# Patient Record
Sex: Male | Born: 1970 | Race: White | Hispanic: No | Marital: Married | Smoking: Never smoker
Health system: Southern US, Community
[De-identification: ages and names within clinical notes are randomized; demographics above are authoritative.]

## PROBLEM LIST (undated history)

## (undated) DIAGNOSIS — I10 Essential (primary) hypertension: Secondary | ICD-10-CM

## (undated) HISTORY — PX: WRIST SURGERY: SHX841

---

## 2008-04-08 ENCOUNTER — Emergency Department (HOSPITAL_COMMUNITY): Admission: EM | Admit: 2008-04-08 | Discharge: 2008-04-08 | Payer: Self-pay | Admitting: Emergency Medicine

## 2009-08-04 ENCOUNTER — Emergency Department (HOSPITAL_COMMUNITY): Admission: EM | Admit: 2009-08-04 | Discharge: 2009-08-04 | Payer: Self-pay | Admitting: Emergency Medicine

## 2010-01-11 ENCOUNTER — Emergency Department (HOSPITAL_COMMUNITY)
Admission: EM | Admit: 2010-01-11 | Discharge: 2010-01-11 | Payer: Self-pay | Source: Home / Self Care | Admitting: Emergency Medicine

## 2010-06-22 NOTE — Op Note (Signed)
NAME:  DAMASCUS, FELDPAUSCH NO.:  [id number]   MEDICAL RECORD NO.:  [id number]          PATIENT TYPE:  EMS   LOCATION:  MAJO                         FACILITY:  [facility]   PHYSICIAN:  Johnette Abraham, MD    DATE OF BIRTH:  1970-07-08   DATE OF PROCEDURE:  DATE OF DISCHARGE:                               OPERATIVE REPORT   PREOPERATIVE DIAGNOSIS:  Crush injury to the left index finger with open  fracture and complex laceration including nailbed injury.   POSTOPERATIVE DIAGNOSIS:  Crush injury to the left index finger with  open fracture and complex laceration including nailbed injury.   PROCEDURE:  Exploration of the wound, irrigation and debridement of  skin, subcutaneous tissue and bone, removal of the nail plate, repair of  the nailbed, and closure of laceration totaling 2 cm.   INDICATIONS:  Mr. Gallaga is a 40 year old male who got his finger caught  at work in a couple of metal rollers crushing the left index finger.  He  presented eventually to the [facility] Emergency Department now as  consulted.  The finger was evaluated.  It was felt that it needed  surgical evaluation and treatment.  Risks, benefits, and alternatives  were discussed with the patient including bleeding, infection, nerve  damage, fracture, nonunion in deed for additional procedures, he agreed  with these risk and agreed to proceed.  Consent was obtained.   PROCEDURE:  The patient was given a digital block.  The hand and finger  was then prepped and draped with Betadine solution.  Once adequate  anesthesia had been obtained, the wound was evaluated.  It was irrigated  with Betadine and irrigation solution.  The old nail plate, which was  avulsed and dislocated was removed.  The skin and subcutaneous tissues  surrounding the distal portion of the finger, as well as along each side  of the nail fold were debrided.  There were a few small bone fragments  distally that were evident in the wound were  also debrided.  The distal  portion of the nailbed was crushed and there was a stellate laceration.  The terminal matrix was intact.  After thorough irrigation, the  lacerations on each side of the finger were approximated with several  interrupted 5-0 nylon sutures.  The nailbed was then approximated with  several interrupted 6-0 chromic sutures reducing the remaining fracture  and approximating the nailbed.  Hemostasis was then obtained with direct  pressure.  Afterwards, the old nail was cleansed and reshaped.  Antibiotic ointment was placed over the nailbed and incision sites.  The  old nail plate was replaced.  A sterile dressing was applied.  The  patient tolerated the procedure well.  Estimated blood loss is minimal.  No specimens.  No acute complications.  The patient will follow up in my  office next week.  He was given a prescription for Lortab and Keflex.      Johnette Abraham, MD  Electronically Signed     HCC/MEDQ  D:  04/08/2008  T:  04/09/2008  Job:  [phone] [id number]

## 2011-08-05 ENCOUNTER — Encounter (HOSPITAL_COMMUNITY): Payer: Self-pay | Admitting: *Deleted

## 2011-08-05 ENCOUNTER — Emergency Department (INDEPENDENT_AMBULATORY_CARE_PROVIDER_SITE_OTHER)
Admission: EM | Admit: 2011-08-05 | Discharge: 2011-08-05 | Disposition: A | Discharge status: Home / Self Care | Payer: Worker's Compensation | Source: Home / Self Care | Attending: Family Medicine | Admitting: Family Medicine

## 2011-08-05 DIAGNOSIS — H5789 Other specified disorders of eye and adnexa: Secondary | ICD-10-CM

## 2011-08-05 DIAGNOSIS — H579 Unspecified disorder of eye and adnexa: Secondary | ICD-10-CM

## 2011-08-05 MED ORDER — TETRACAINE HCL 0.5 % OP SOLN
OPHTHALMIC | Status: AC
  Filled 2011-08-05: qty 2

## 2011-08-05 MED ORDER — ERYTHROMYCIN 5 MG/GM OP OINT: TOPICAL_OINTMENT | OPHTHALMIC | Status: AC

## 2011-08-05 NOTE — ED Notes (Signed)
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

## 2011-08-05 NOTE — Discharge Instructions (Signed)
Follow up with the eye doctor if your eye does not get better or if you continue to feel like something is in your eye.

## 2011-08-06 NOTE — ED Provider Notes (Signed)
Medical screening examination/treatment/procedure(s) were performed by non-physician practitioner and as supervising physician I was immediately available for consultation/collaboration.   [facility]; MD   Sharin Grave, MD 08/06/11 1256

## 2011-08-06 NOTE — ED Provider Notes (Signed)
History     CSN: [id number]  Arrival date & time 08/05/11  4098   First MD Initiated Contact with Patient 08/05/11 2111      Chief Complaint  Patient presents with  . 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  Washed it out, but foreign body sensation persists.    Patient is a 41 y.o. male presenting with eye problem. The history is provided by the patient.  Eye Problem  This is a new problem. The current episode started 3 to 5 hours ago. The problem occurs constantly. The problem has been gradually worsening. There is pain in the right eye. The injury mechanism was a foreign body. The pain is at a severity of 8/10. There is no history of trauma to the eye. There is no known exposure to pink eye. She does not wear contacts. Associated symptoms include foreign body sensation and eye redness. Pertinent negatives include no blurred vision, no decreased vision, no discharge, no double vision, no photophobia, no nausea, no vomiting, no tingling and no weakness. She has tried water for the symptoms. The treatment provided no relief.    History reviewed. No pertinent past medical history.  History reviewed. No pertinent past surgical history.  History reviewed. No pertinent family history.  History  Substance Use Topics  . Smoking status: Not on file  . Smokeless tobacco: Not on file  . Alcohol Use: Not on file    OB History    Grav Para Term Preterm Abortions TAB SAB Ect Mult Living                  Review of Systems  Constitutional: Negative for fever and chills.  Eyes: Positive for pain and redness. Negative for blurred vision, double vision, photophobia, discharge, itching and visual disturbance.  Gastrointestinal: Negative for nausea and vomiting.  Neurological: Negative for tingling, weakness and headaches.    Allergies    Review of patient's allergies indicates no known allergies.  Home Medications   Current Outpatient Rx  Name Route Sig Dispense Refill  . ERYTHROMYCIN 5 MG/GM OP OINT  Place a 1/2 inch ribbon of ointment into the lower eyelid of R eye TID for 5-7 days. 1 g 0    BP 113/72  Pulse 57  Temp 98.1 F (36.7 C) (Oral)  Resp 18  SpO2 98%  Physical Exam  Constitutional: She appears well-developed and well-nourished. No distress.  Eyes: EOM and lids are normal. Pupils are equal, round, and reactive to light. No foreign bodies found. Right eye exhibits no discharge and no exudate. No foreign body present in the right eye. Left eye exhibits no discharge and no exudate. No foreign body present in the left eye. Right conjunctiva is injected. Left conjunctiva is not injected.  Slit lamp exam:      The right eye shows no corneal abrasion, no corneal ulcer and no foreign body.       The left eye shows no fluorescein uptake.  Pulmonary/Chest: Effort normal.  Lymphadenopathy:       Head (right side): No submental, no submandibular, no tonsillar, no preauricular and no posterior auricular adenopathy present.       Head (left side): No submental, no submandibular, no tonsillar, no preauricular and no posterior auricular adenopathy present.    ED Course  Procedures (including critical care time)  Labs Reviewed - No data to display No results  found.   1. Eye irritation       MDM  Negative eye exam except very mild injection.  Early viral conjunctivitis vs foreign body removed by pt.  Although no corneal abrasions or foreign body, will treat as if pt had foreign body he was able to remove himself prior to exam.          Cathlyn Parsons, NP 08/06/11 1128

## 2011-11-24 ENCOUNTER — Ambulatory Visit: Payer: Self-pay | Admitting: Internal Medicine

## 2014-02-19 ENCOUNTER — Encounter (HOSPITAL_COMMUNITY): Payer: Self-pay | Admitting: Emergency Medicine

## 2014-02-19 ENCOUNTER — Emergency Department (HOSPITAL_COMMUNITY): Payer: BLUE CROSS/BLUE SHIELD

## 2014-02-19 ENCOUNTER — Emergency Department (HOSPITAL_COMMUNITY)
Admission: EM | Admit: 2014-02-19 | Discharge: 2014-02-19 | Disposition: A | Discharge status: Home / Self Care | Payer: BLUE CROSS/BLUE SHIELD | Attending: Emergency Medicine | Admitting: Emergency Medicine

## 2014-02-19 DIAGNOSIS — Z87828 Personal history of other (healed) physical injury and trauma: Secondary | ICD-10-CM | POA: Insufficient documentation

## 2014-02-19 DIAGNOSIS — M25531 Pain in right wrist: Secondary | ICD-10-CM | POA: Diagnosis present

## 2014-02-19 MED ORDER — HYDROCODONE-ACETAMINOPHEN 5-325 MG PO TABS
2.0000 | ORAL_TABLET | Freq: Once | ORAL | Status: AC
  Administered 2014-02-19: 2 via ORAL
  Filled 2014-02-19: qty 2

## 2014-02-19 MED ORDER — HYDROCODONE-ACETAMINOPHEN 5-325 MG PO TABS: 1.0000 | ORAL_TABLET | ORAL | Status: DC | PRN

## 2014-02-19 NOTE — ED Provider Notes (Signed)
CSN: [id number]     Arrival date & time 02/19/14  [id number] History  This chart was scribed for non-physician practitioner, Emilia BeckKaitlyn Malasha Kleppe, PA-C working with Vanetta MuldersScott Zackowski, MD by Greggory StallionKayla Andersen, ED scribe. This patient was seen in room TR08C/TR08C and the patient's care was started at 9:55 AM.     Chief Complaint  Patient presents with  . Wrist Pain   The history is provided by the patient. No language interpreter was used.    HPI Comments: Terry DameJonathan M Hoffmann is a 44 y.o. male who presents to the Emergency Department complaining of worsening right wrist pain that started 5 days ago. Reports history of wrist injury and surgery. Denies new injury but states he works in a [employer] and has to do a lot of repetitive movements. Certain movements worsen pain. Pt has taken ibuprofen with no relief.   History reviewed. No pertinent past medical history. Past Surgical History  Procedure Laterality Date  . Wrist surgery     No family history on file. History  Substance Use Topics  . Smoking status: Never Smoker   . Smokeless tobacco: Not on file  . Alcohol Use: Yes    Review of Systems  Musculoskeletal: Positive for arthralgias.  All other systems reviewed and are negative.  Allergies  Review of patient's allergies indicates no known allergies.  Home Medications   Prior to Admission medications   Medication Sig Start Date End Date Taking? Authorizing Provider  ibuprofen (ADVIL,MOTRIN) 200 MG tablet Take 400-600 mg by mouth every 6 (six) hours as needed for mild pain.   Yes Historical Provider, MD   BP 153/99 mmHg  Pulse 74  Temp(Src) 97.3 F (36.3 C) (Oral)  Resp 18  SpO2 100%   Physical Exam  Constitutional: He is oriented to person, place, and time. He appears well-developed and well-nourished. No distress.  HENT:  Head: Normocephalic and atraumatic.  Eyes: Conjunctivae and EOM are normal.  Neck: Neck supple. No tracheal deviation present.  Cardiovascular: Normal rate.    Pulmonary/Chest: Effort normal. No respiratory distress.  Abdominal: Soft. He exhibits no distension.  Musculoskeletal: Normal range of motion.  Limited ROM of right wrist. Full ROM of fingers of right hand. No obvious joint deformity.   Neurological: He is alert and oriented to person, place, and time. Coordination normal.  Grip strength intact. Sensation to light palpation of right hand intact.   Skin: Skin is warm and dry.  Psychiatric: He has a normal mood and affect. His behavior is normal.  Nursing note and vitals reviewed.   ED Course  Procedures (including critical care time)  DIAGNOSTIC STUDIES: Oxygen Saturation is 100% on RA, normal by my interpretation.    COORDINATION OF CARE: 9:58 AM-Discussed treatment plan which includes wrist xray with pt at bedside and pt agreed to plan. Advised pt that if xray is negative, he will be discharged with an orthopedic follow up and a short course pain medication.   SPLINT APPLICATION Date/Time: 11:04 AM Authorized by: Emilia BeckKaitlyn Vasco Chong Consent: Verbal consent obtained. Risks and benefits: risks, benefits and alternatives were discussed Consent given by: patient Splint applied by: nurse Location details: right wrist Splint type: velcro thumb spica Supplies used: velcro thumb spica Post-procedure: The splinted body part was neurovascularly unchanged following the procedure. Patient tolerance: Patient tolerated the procedure well with no immediate complications.     Labs Review Labs Reviewed - No data to display  Imaging Review Dg Wrist Complete Right  02/19/2014   CLINICAL DATA:  RIGHT wrist pain 1 week ago. No known injury. Increasing pain since injury.  EXAM: RIGHT WRIST - COMPLETE 3+ VIEW  COMPARISON:  None.  FINDINGS: Volar T plate fixation of the distal radius is present. Distal radius fracture is healed. Tiny avulsion fracture from the ulnar styloid noted which is well corticated and chronic. Carpal spacing and alignment  appears within normal limits.  IMPRESSION: Old ORIF with healed distal radius fracture.  No acute abnormality.   Electronically Signed   By: Andreas Newport M.D.   On: 02/19/2014 10:26     EKG Interpretation None      MDM   Final diagnoses:  Right wrist pain    11:03 AM Xray unremarkable for acute changes. No neurovascular compromise. Patient will have velcro thumb spica to wear for support and Vicodin for pain. Patient instructed to follow up with Dr. Amanda Pea.   I personally performed the services described in this documentation, which was scribed in my presence. The recorded information has been reviewed and is accurate.  Emilia Beck, PA-C 02/19/14 1110  Vanetta Mulders, MD 02/20/14 [phone]

## 2014-02-19 NOTE — ED Notes (Signed)
Patient states hurt wrist "a long time ago and had surgery but it just started hurting again real bad".   Patient denies new injury.   Patient states has been "eating ibuprofen for pain".

## 2014-02-19 NOTE — Discharge Instructions (Signed)
Take Vicodin as needed for pain. Wear thumb spica as needed for support. Follow up with Dr. Amanda PeaGramig for further evaluation. Refer to attached documents for more information.

## 2014-02-19 NOTE — ED Notes (Signed)
Ortho to come place thumb spica on patient.

## 2014-02-19 NOTE — Progress Notes (Signed)
Orthopedic Tech Progress Note Patient Details:  Terry DameJonathan M Wells 03/17/1970 [id number]  Ortho Devices Type of Ortho Device: Thumb velcro splint Ortho Device/Splint Location: rue Ortho Device/Splint Interventions: Application   Nikki DomCrawford, Janeth Terry 02/19/2014, 11:50 AM

## 2015-01-18 ENCOUNTER — Emergency Department (HOSPITAL_COMMUNITY)
Admission: EM | Admit: 2015-01-18 | Discharge: 2015-01-18 | Disposition: A | Discharge status: Home / Self Care | Payer: BLUE CROSS/BLUE SHIELD | Attending: Emergency Medicine | Admitting: Emergency Medicine

## 2015-01-18 ENCOUNTER — Encounter (HOSPITAL_COMMUNITY): Payer: Self-pay | Admitting: Emergency Medicine

## 2015-01-18 DIAGNOSIS — J029 Acute pharyngitis, unspecified: Secondary | ICD-10-CM | POA: Diagnosis present

## 2015-01-18 DIAGNOSIS — H66002 Acute suppurative otitis media without spontaneous rupture of ear drum, left ear: Secondary | ICD-10-CM | POA: Diagnosis not present

## 2015-01-18 MED ORDER — AMOXICILLIN 500 MG PO CAPS: 500.0000 mg | ORAL_CAPSULE | Freq: Three times a day (TID) | ORAL | Status: DC

## 2015-01-18 MED ORDER — PREDNISONE 20 MG PO TABS: 40.0000 mg | ORAL_TABLET | Freq: Every day | ORAL | Status: DC

## 2015-01-18 MED ORDER — PREDNISONE 20 MG PO TABS
60.0000 mg | ORAL_TABLET | Freq: Once | ORAL | Status: AC
  Administered 2015-01-18: 60 mg via ORAL
  Filled 2015-01-18: qty 3

## 2015-01-18 MED ORDER — AMOXICILLIN 500 MG PO CAPS
500.0000 mg | ORAL_CAPSULE | Freq: Once | ORAL | Status: AC
  Administered 2015-01-18: 500 mg via ORAL
  Filled 2015-01-18: qty 1

## 2015-01-18 NOTE — Discharge Instructions (Signed)
Do not hesitate to return to the emergency room for any new, worsening or concerning symptoms.  Please obtain primary care using resource guide below. Let them know that you were seen in the emergency room and that they will need to obtain records for further outpatient management.   Otitis Media, Adult Otitis media is redness, soreness, and inflammation of the middle ear. Otitis media may be caused by allergies or, most commonly, by infection. Often it occurs as a complication of the common cold. SIGNS AND SYMPTOMS Symptoms of otitis media may include:  Earache.  Fever.  Ringing in your ear.  Headache.  Leakage of fluid from the ear. DIAGNOSIS To diagnose otitis media, your health care provider will examine your ear with an otoscope. This is an instrument that allows your health care provider to see into your ear in order to examine your eardrum. Your health care provider also will ask you questions about your symptoms. TREATMENT  Typically, otitis media resolves on its own within 3-5 days. Your health care provider may prescribe medicine to ease your symptoms of pain. If otitis media does not resolve within 5 days or is recurrent, your health care provider may prescribe antibiotic medicines if he or she suspects that a bacterial infection is the cause. HOME CARE INSTRUCTIONS   If you were prescribed an antibiotic medicine, finish it all even if you start to feel better.  Take medicines only as directed by your health care provider.  Keep all follow-up visits as directed by your health care provider. SEEK MEDICAL CARE IF:  You have otitis media only in one ear, or bleeding from your nose, or both.  You notice a lump on your neck.  You are not getting better in 3-5 days.  You feel worse instead of better. SEEK IMMEDIATE MEDICAL CARE IF:   You have pain that is not controlled with medicine.  You have swelling, redness, or pain around your ear or stiffness in your  neck.  You notice that part of your face is paralyzed.  You notice that the bone behind your ear (mastoid) is tender when you touch it. MAKE SURE YOU:   Understand these instructions.  Will watch your condition.  Will get help right away if you are not doing well or get worse.   This information is not intended to replace advice given to you by your health care provider. Make sure you discuss any questions you have with your health care provider.   Document Released: 10/30/2003 Document Revised: 02/14/2014 Document Reviewed: 08/21/2012 Elsevier Interactive Patient Education [employer]2016 Elsevier Inc.   Emergency Department Resource Guide 1) Find a Doctor and Pay Out of Pocket Although you won't have to find out who is covered by your insurance plan, it is a good idea to ask around and get recommendations. You will then need to call the office and see if the doctor you have chosen will accept you as a new patient and what types of options they offer for patients who are self-pay. Some doctors offer discounts or will set up payment plans for their patients who do not have insurance, but you will need to ask so you aren't surprised when you get to your appointment.  2) Contact Your Local Health Department Not all health departments have doctors that can see patients for sick visits, but many do, so it is worth a call to see if yours does. If you don't know where your local health department is, you can check in  your phone book. The CDC also has a tool to help you locate your state's health department, and many state websites also have listings of all of their local health departments.  3) Find a [facility] If your illness is not likely to be very severe or complicated, you may want to try a walk in clinic. These are popping up all over the country in pharmacies, drugstores, and shopping centers. They're usually staffed by nurse practitioners or physician assistants that have been trained to  treat common illnesses and complaints. They're usually fairly quick and inexpensive. However, if you have serious medical issues or chronic medical problems, these are probably not your best option.  No Primary Care Doctor: - Call Health Connect at  ([phone] - they can help you locate a primary care doctor that  accepts your insurance, provides certain services, etc. - Physician Referral Service- [phone]  Chronic Pain Problems: Organization         Address  Phone   Notes  [facility]  [phone] Patients need to be referred by their primary care doctor.   Medication Assistance: Organization         Address  Phone   Notes  [facility] [address] [place], [place] [address] [phone] --Must be a resident of [facility] -- Must have NO insurance coverage whatsoever (no Medicaid/ Medicare, etc.) -- The pt. MUST have a primary care doctor that directs their care regularly and follows them in the community   [facility]  ([phone]   [employer]  [phone]    Agencies that provide inexpensive medical care: Organization         Address  Phone   Notes  [place]  [phone]   [facility]    [phone]   [facility] [place], [place] [address] [phone]   [place] [address] [place] [phone]   Planned Parenthood    [phone]   [facility]    [phone]   [place] and [place] [address], [place] Phone:  [phone], Fax:  [phone] Hours of Operation:  9 am - 6 pm, M-F.  Also accepts Medicaid/Medicare and self-pay.  [facility] for [place] [address], [address], [place] Phone: [phone], Fax: [phone]. Hours of Operation:  8:30 am - 5:30 pm, M-F.  Also accepts Medicaid and self-pay.  [facility]  [place] [address] [place] Phone: [phone]   [place], [place], [place] [phone], Ext. [phone] Mondays & Thursdays: 7-9 AM.  First 15 patients are seen on a first come, first serve basis.    [place] Providers:  Organization         Address  Phone   Notes  [facility] [address], [place] [phone] Also accepts self-pay patients.  [facility] [id number] [place], [place]  [phone]   [place], [address], [place] [phone]   [facility] [address] [place] [phone]   Lucianne Lei [address], [place]   [phone] Only accepts [place] [facility] [place] patients after they have their name applied to their card.  Self-Pay (no insurance) in [place]: ° °Organization         Address  Phone   Notes  °Sickle Cell Patients, [facility] [address], [place] ([phone]) [phone]   °[facility] [address], [place] ([phone]) [phone]   °[facility] [place] ° [address], [place] ([phone]) [phone]   °[facility]/Dr. Osei-Bonsu ° [address] [place] [address], [place] or [address], [place] [phone]) [phone] Phone number for both [place] and [place] locations is the same.  °[facility] [address], [place] ([phone]) [phone]   °[facility] [address] [place] [address], [place] or [address] (336) [phone] °(336) [phone]   °[facility] [address], [place] (336) [phone], phone; ([phone], fax Sees patients 1st and 3rd Saturday of every month.  Must not qualify for public or private insurance (i.e. Medicaid, Medicare, [place] Health Choice, Veterans' Benefits) • Household income should be no more than 200%  of the poverty level •The clinic cannot treat you if you are pregnant or think you are pregnant • Sexually transmitted diseases are not treated at the clinic.  ° ° °Dental Care: °Organization         Address  Phone  Notes  °[place] [facility] [address],  ([phone]) [phone] Accepts children up to age 21 who are enrolled in Medicaid or [place] Health Choice; pregnant women with a Medicaid card; and children who have applied for Medicaid or [place] Health Choice, but were declined, whose parents can pay a reduced fee at time of service.  °[place] [facility] [place]  [address], [place] (336) [phone] Accepts children up to age 21 who are enrolled in Medicaid or [place] Health Choice; pregnant women with a Medicaid card; and children who have applied for Medicaid or [place] Health Choice, but were declined, whose parents can pay a reduced fee at time of service.  °[facility] ° [address],  ([phone]) [phone] Patients are seen by appointment only. Walk-ins are not accepted. [facility] will see patients 18 years of age and older. °Monday - Tuesday (8am-5pm) °Most Wednesdays (8:30-5pm) °$30 per visit, cash only  °[facility] ° [address], [place] ([phone]) [phone] Patients are seen by appointment only. Walk-ins are not accepted. [facility] will see patients 18 years of age and older. °One Wednesday Evening (Monthly: Volunteer Based).  $30 per visit, cash only  °[facility]  ([phone]) [phone] for adults; Children under age 4, call Graduate Pediatric Dentistry at [phone]. Children aged 4-14, please call [phone] to request a pediatric application. ° Dental services are provided in all areas of dental care including fillings, crowns and bridges, complete and partial dentures, implants, gum treatment, root canals, and  extractions. Preventive care is also provided. Treatment is provided to both adults and children. °Patients are selected via a lottery and there is often a waiting list. °  °[facility] [address], ° ° (336) 763-8833 [email or web address] °  °[facility] [address], [place], [place] (336)723-1848, Ext. [phone] Second and Fourth Thursday of each month, opens at 6:30 AM; Clinic ends at 9 AM.  Patients are seen on a first-come first-served basis, and a limited number   are seen during each clinic.   [facility]  [address] [place], [place] [phone]   Eligibility Requirements You must have lived in [place], [place], or [place] counties for at least the last three months.   You cannot be eligible for state or federal sponsored [place], including [employer], [place], or [employer].   You generally cannot be eligible for healthcare insurance through your employer.    How to apply: Eligibility screenings are held every Tuesday and Wednesday afternoon from 1:00 pm until 4:00 pm. You do not need an appointment for the interview!  [facility] [address], [place], [place] [phone]   [facility] Health Department  [phone]   [facility]  [phone]   [facility] Health Department  [phone]    Behavioral Health Resources in the Community: Intensive Outpatient Programs Organization         Address  Phone  Notes  [facility] Services [address], [place], [place] [phone]   [facility] Outpatient [address] [place], [place] [phone]   [facility] [address] [place], [place]  [phone]   [facility] Mental Health [address]  [place], [place] [phone] or 5043701957   Substance Abuse Resources Organization         Address  Phone  Notes  [facility]  (781)[phone]    [facility]  [phone]   [facility]  [phone]   Floydene Flock  ([phone]) [phone]   Residential & Outpatient Substance Abuse Program  (618)160-3085   Psychological Services Organization         Address  Phone  Notes  [facility]  [phone]   [facility]  [phone]   [facility] [address], [place] [phone] or [phone]    Mobile Crisis Teams Organization         Address  Phone  Notes  [facility], Mobile Crisis Care Unit  [phone]   [facility]  [address] [place], [place] [phone]   Doristine Locks [address] [place] [phone]    Self-Help/Support Groups Organization         Address  Phone             Notes  [facility] [place] - variety of support groups  336- I7437963 Call for more information  Narcotics Anonymous (NA), [facility] [address], [employer] [place]  2 meetings at this location   Statistician         Address  Phone  Notes  [facility] [address],    [place]  [phone]   [facility]  [address], [place] [address], [place], [place] [phone]   [facility] [address] [place], [place] [phone] Admissions: 8am-3pm M-F  [facility] [address],    [place], [place] [phone]   [facility] [address] [place], [place] [phone]   [facility] [address]  [place], [place] [phone]   [facility] - Intensive Outpatient [address], [place], [place] [phone]   [facility] ([facility] [address] [place]  [place], [place] [phone] or [phone]   [facility] ([facility]) [address], [place], [place] [phone] Accepts Medicaid  [facility] [address]  [place] 1-800-659-3381 Substance Abuse/Addiction Treatment  ° °[place] Behavioral Health Resources °Organization         Address  Phone  Notes  °[facility]  (888) 581-9988   °Julie Brannon, PhD [address] [place], [place]   (336) 349-5553 or (336) 951-0000   °[facility]   [address] °[place], [place] (336) 349-4454   °[facility] [address], [place], [place] (336) [phone] Insurance/Medicaid/sponsorship through [facility]  °[facility] [address]                                    [place], [place] (336) [phone] Therapy/tele-psych/case  °[facility] [address]  ° [place], [place] (336) [phone]    °Dr. Arfeen  (336) [phone]   °[facility] [place]  [employer] [place] [facility] 1) [address], [place] °2) [address], [place] °3)  [address], [place] (336) 349-3220 °(336) 342-7768 ° °(336) 342-8140   °[place] Child Abuse Hotline (336) 342-1394 or (336) 342-3537 (After Hours)    ° ° ° °

## 2015-01-18 NOTE — ED Notes (Signed)
Pt is in stable condition upon d/c and ambulates from ED. 

## 2015-01-18 NOTE — ED Notes (Signed)
Pt c/o sore throat, swollen tonsils and sinus pain ongoing since Thursday.

## 2015-01-18 NOTE — ED Provider Notes (Signed)
CSN: [id number]     Arrival date & time 01/18/15  4098 History  By signing my name below, I, Elon Spanner, attest that this documentation has been prepared under the direction and in the presence of [employer], PA-C. Electronically Signed: Elon Spanner ED Scribe. 01/18/2015. 11:10 AM.    Chief Complaint  Patient presents with  . Sore Throat   The history is provided by the patient. No language interpreter was used.    HPI Comments: Terry Wells is a 44 y.o. male with no significant hx who presents to the Emergency Department complaining of left ear pain onset 3 days ago; unchanged with ibuprofen.  He also reports a left-sided headache, mild congestion, and sore throat worse with with swallowing and talking.  He denies hx of DM.  He denies rhinorrhea, fever, cough.  NKA.  History reviewed. No pertinent past medical history. Past Surgical History  Procedure Laterality Date  . Wrist surgery     No family history on file. Social History  Substance Use Topics  . Smoking status: Never Smoker   . Smokeless tobacco: None  . Alcohol Use: Yes    Review of Systems A complete 10 system review of systems was obtained and all systems are negative except as noted in the HPI and PMH.  Allergies  Review of patient's allergies indicates no known allergies.  Home Medications   Prior to Admission medications   Medication Sig Start Date End Date Taking? Authorizing Provider  HYDROcodone-acetaminophen (NORCO/VICODIN) 5-325 MG per tablet Take 1-2 tablets by mouth every 4 (four) hours as needed for moderate pain or severe pain. 02/19/14   Kaitlyn Szekalski, PA-C  ibuprofen (ADVIL,MOTRIN) 200 MG tablet Take 400-600 mg by mouth every 6 (six) hours as needed for mild pain.    Historical Provider, MD   BP 134/108 mmHg  Pulse 89  Temp(Src) 97.7 F (36.5 C) (Oral)  Resp 20  Ht  (1.88 m)  Wt 280 lb (127.007 kg)  BMI 35.93 kg/m2  SpO2 97% Physical Exam  Constitutional: He is oriented to  person, place, and time. He appears well-developed and well-nourished. No distress.  HENT:  Head: Normocephalic and atraumatic.  Left Ear: External ear normal.  Nose: Nose normal.  Mouth/Throat: Oropharynx is clear and moist.  No drooling or stridor. Posterior pharynx mildly erythematous no significant tonsillar hypertrophy. No exudate. Soft palate rises symmetrically. No TTP or induration under tongue.   No tenderness to palpation of frontal or bilateral maxillary sinuses.  No mucosal edema in the nares.  ++ Left tympanic membrane erythematous and bulging. Right tympanic membranes with normal architecture and good light reflex.    Eyes: Conjunctivae and EOM are normal. Pupils are equal, round, and reactive to light.  Neck: Normal range of motion. Neck supple. No JVD present. No tracheal deviation present. No thyromegaly present.  Cardiovascular: Normal rate.   Pulmonary/Chest: Effort normal. No stridor. No respiratory distress.  Musculoskeletal: Normal range of motion.  Lymphadenopathy:    He has no cervical adenopathy.  Neurological: He is alert and oriented to person, place, and time.  Skin: Skin is warm and dry.  Psychiatric: He has a normal mood and affect. His behavior is normal.  Nursing note and vitals reviewed.   ED Course  Procedures (including critical care time)  DIAGNOSTIC STUDIES: Oxygen Saturation is 97% on RA, normal by my interpretation.    COORDINATION OF CARE:  11:07 AM Discussed suspicion of ear infection.  Will prescribe amoxicillin and oral steroids.  Patient acknowledges and agrees with plan.    Labs Review Labs Reviewed - No data to display  Imaging Review No results found. I have personally reviewed and evaluated these images and lab results as part of my medical decision-making.   EKG Interpretation None      MDM   Final diagnoses:  Acute suppurative otitis media of left ear without spontaneous rupture of tympanic membrane, recurrence not  specified    Filed Vitals:   01/18/15 1003 01/18/15 1012  BP: 134/108   Pulse: 89   Temp: 97.7 F (36.5 C)   TempSrc: Oral   Resp: 20   Height:  6\' 2"  (1.88 m)  Weight:  127.007 kg  SpO2: 97%     Medications  amoxicillin (AMOXIL) capsule 500 mg (not administered)  predniSONE (DELTASONE) tablet 60 mg (not administered)    Terry Wells is 44 y.o. male presenting with sore throat, left ear pain. Left tympanic membrane bulging consistent with otitis media. Patient will be started on amoxicillin and given prednisone for comfort.  Evaluation does not show pathology that would require ongoing emergent intervention or inpatient treatment. Pt is hemodynamically stable and mentating appropriately. Discussed findings and plan with patient/guardian, who agrees with care plan. All questions answered. Return precautions discussed and outpatient follow up given.   New Prescriptions   AMOXICILLIN (AMOXIL) 500 MG CAPSULE    Take 1 capsule (500 mg total) by mouth 3 (three) times daily.   PREDNISONE (DELTASONE) 20 MG TABLET    Take 2 tablets (40 mg total) by mouth daily.     I personally performed the services described in this documentation, which was scribed in my presence. The recorded information has been reviewed and is accurate.    Wynetta Emeryicole Lajuan Kovaleski, PA-C 01/18/15 1121  Cathren LaineKevin Steinl, MD 01/18/15 [phone]

## 2015-05-11 ENCOUNTER — Emergency Department (HOSPITAL_COMMUNITY): Payer: BLUE CROSS/BLUE SHIELD

## 2015-05-11 ENCOUNTER — Encounter (HOSPITAL_COMMUNITY): Payer: Self-pay | Admitting: *Deleted

## 2015-05-11 ENCOUNTER — Emergency Department (HOSPITAL_COMMUNITY)
Admission: EM | Admit: 2015-05-11 | Discharge: 2015-05-11 | Disposition: A | Discharge status: Home / Self Care | Payer: BLUE CROSS/BLUE SHIELD | Attending: Emergency Medicine | Admitting: Emergency Medicine

## 2015-05-11 DIAGNOSIS — M546 Pain in thoracic spine: Secondary | ICD-10-CM | POA: Insufficient documentation

## 2015-05-11 DIAGNOSIS — R319 Hematuria, unspecified: Secondary | ICD-10-CM | POA: Diagnosis not present

## 2015-05-11 DIAGNOSIS — M25512 Pain in left shoulder: Secondary | ICD-10-CM | POA: Diagnosis not present

## 2015-05-11 DIAGNOSIS — J069 Acute upper respiratory infection, unspecified: Secondary | ICD-10-CM | POA: Insufficient documentation

## 2015-05-11 DIAGNOSIS — Z79899 Other long term (current) drug therapy: Secondary | ICD-10-CM | POA: Diagnosis not present

## 2015-05-11 DIAGNOSIS — R079 Chest pain, unspecified: Secondary | ICD-10-CM | POA: Diagnosis not present

## 2015-05-11 DIAGNOSIS — R05 Cough: Secondary | ICD-10-CM | POA: Diagnosis present

## 2015-05-11 DIAGNOSIS — R42 Dizziness and giddiness: Secondary | ICD-10-CM | POA: Insufficient documentation

## 2015-05-11 LAB — URINE MICROSCOPIC-ADD ON: WBC UA: NONE SEEN WBC/hpf (ref 0–5)

## 2015-05-11 LAB — URINALYSIS, ROUTINE W REFLEX MICROSCOPIC
BILIRUBIN URINE: NEGATIVE
GLUCOSE, UA: NEGATIVE mg/dL
Ketones, ur: NEGATIVE mg/dL
Leukocytes, UA: NEGATIVE
NITRITE: NEGATIVE
PH: 6.5 (ref 5.0–8.0)
Protein, ur: NEGATIVE mg/dL
SPECIFIC GRAVITY, URINE: 1.013 (ref 1.005–1.030)

## 2015-05-11 MED ORDER — BENZONATATE 100 MG PO CAPS: 100.0000 mg | ORAL_CAPSULE | Freq: Three times a day (TID) | ORAL | Status: DC

## 2015-05-11 NOTE — ED Provider Notes (Signed)
CSN: [id number]     Arrival date & time 05/11/15  1151 History   First MD Initiated Contact with Patient 05/11/15 1741     Chief Complaint  Patient presents with  . Back Pain  . URI     (Consider location/radiation/quality/duration/timing/severity/associated sxs/prior Treatment) HPI Comments: Patient is a previously healthy 45 year old male who presents with three-day history of productive cough, congestion, sore throat, sneezing, frontal headache. The day after the symptoms began, the patient began having some upper left shoulder and back pain that the patient describes as an annoying tightness and rates the pain as a 2/10. Patient endorses some sheeted chest tightness and lightheadedness. Patient has had a good appetite. He began healing chills today. She has been taking Claritin at home. Denies fevers, chest pain, shortness of breath, abdominal pain, nausea, vomiting, diarrhea. Patient denies any injury to his back. Pt is a former smoker, quit in 2010.  Patient is a 45 y.o. male presenting with back pain and URI. The history is provided by the patient.  Back Pain Associated symptoms: headaches   Associated symptoms: no abdominal pain, no chest pain, no dysuria and no fever   URI Presenting symptoms: congestion, cough and sore throat   Presenting symptoms: no ear pain and no fever   Associated symptoms: headaches and sneezing     History reviewed. No pertinent past medical history. Past Surgical History  Procedure Laterality Date  . Wrist surgery     History reviewed. No pertinent family history. Social History  Substance Use Topics  . Smoking status: Never Smoker   . Smokeless tobacco: None  . Alcohol Use: Yes    Review of Systems  Constitutional: Positive for chills. Negative for fever and appetite change.  HENT: Positive for congestion, sinus pressure, sneezing and sore throat. Negative for ear pain, facial swelling and trouble swallowing.   Respiratory: Positive for cough  and chest tightness. Negative for shortness of breath.   Cardiovascular: Negative for chest pain.  Gastrointestinal: Negative for nausea, vomiting and abdominal pain.  Genitourinary: Negative for dysuria.  Musculoskeletal: Positive for back pain.  Skin: Negative for rash and wound.  Neurological: Positive for light-headedness and headaches.  Psychiatric/Behavioral: The patient is not nervous/anxious.       Allergies  Review of patient's allergies indicates no known allergies.  Home Medications   Prior to Admission medications   Medication Sig Start Date End Date Taking? Authorizing Provider  ibuprofen (ADVIL,MOTRIN) 200 MG tablet Take 400-600 mg by mouth every 6 (six) hours as needed for mild pain.   Yes Historical Provider, MD  loratadine-pseudoephedrine (CLARITIN-D 24-HOUR) 10-240 MG 24 hr tablet Take 1 tablet by mouth daily.   Yes Historical Provider, MD  amoxicillin (AMOXIL) 500 MG capsule Take 1 capsule (500 mg total) by mouth 3 (three) times daily. Patient not taking: Reported on 05/11/2015 01/18/15   Joni Reining Pisciotta, PA-C  benzonatate (TESSALON) 100 MG capsule Take 1 capsule (100 mg total) by mouth every 8 (eight) hours. 05/11/15   Emi Holes, PA-C  HYDROcodone-acetaminophen (NORCO/VICODIN) 5-325 MG per tablet Take 1-2 tablets by mouth every 4 (four) hours as needed for moderate pain or severe pain. Patient not taking: Reported on 05/11/2015 02/19/14   Emilia Beck, PA-C  predniSONE (DELTASONE) 20 MG tablet Take 2 tablets (40 mg total) by mouth daily. Patient not taking: Reported on 05/11/2015 01/18/15   Joni Reining Pisciotta, PA-C   BP 128/72 mmHg  Pulse 96  Temp(Src) 98.6 F (37 C) (Oral)  Resp 18  SpO2 100% Physical Exam  Constitutional: He appears well-developed and well-nourished. No distress.  HENT:  Head: Normocephalic and atraumatic.  Mouth/Throat: Oropharynx is clear and moist. No oropharyngeal exudate.  Eyes: Conjunctivae are normal. Pupils are equal, round, and  reactive to light. Right eye exhibits no discharge. Left eye exhibits no discharge. No scleral icterus.  Neck: Normal range of motion. Neck supple. No thyromegaly present.  Cardiovascular: Normal rate, regular rhythm and normal heart sounds.  Exam reveals no gallop and no friction rub.   No murmur heard. Pulmonary/Chest: Effort normal and breath sounds normal. No stridor. No respiratory distress. He has no wheezes. He has no rales.  Abdominal: Soft. Bowel sounds are normal. He exhibits no distension. There is no tenderness. There is no rebound and no guarding.  Musculoskeletal: He exhibits no edema.  Lymphadenopathy:    He has no cervical adenopathy.  Neurological: He is alert. Coordination normal.  Skin: Skin is warm and dry. No rash noted. He is not diaphoretic. No pallor.  Psychiatric: He has a normal mood and affect.  Nursing note and vitals reviewed.   ED Course  Procedures (including critical care time) Labs Review Labs Reviewed  URINALYSIS, ROUTINE W REFLEX MICROSCOPIC (NOT AT [facility]) - Abnormal; Notable for the following:    Hgb urine dipstick SMALL (*)    All other components within normal limits  URINE MICROSCOPIC-ADD ON - Abnormal; Notable for the following:    Squamous Epithelial / LPF 0-5 (*)    Bacteria, UA RARE (*)    All other components within normal limits    Imaging Review Dg Chest 2 View  05/11/2015  CLINICAL DATA:  Sinus congestion, headache for 2 days EXAM: CHEST  2 VIEW COMPARISON:  None. FINDINGS: Cardiomediastinal silhouette is unremarkable. No acute infiltrate or pleural effusion. No pulmonary edema. Mild basilar atelectasis. IMPRESSION: No active cardiopulmonary disease. Electronically Signed   By: Natasha MeadLiviu  Pop M.D.   On: 05/11/2015 18:30   Dg Abd 1 View  05/11/2015  CLINICAL DATA:  Back pain, left flank pain, hematuria EXAM: ABDOMEN - 1 VIEW COMPARISON:  None FINDINGS: There is nonobstructive small bowel gas pattern. Some colonic gas noted in right colon and  transverse colon. No pathologic calcifications are noted. IMPRESSION: Nonobstructed small bowel gas pattern. Some colonic gas noted in proximal colon. No renal or ureteral calcifications. Electronically Signed   By: Natasha MeadLiviu  Pop M.D.   On: 05/11/2015 18:32   I have personally reviewed and evaluated these images and lab results as part of my medical decision-making.   EKG Interpretation None      MDM   Pt symptoms consistent with URI. CXR negative for acute infiltrate. Suspect back pain related to coughing and sneezing. Pt will be discharged with symptomatic treatment.  Discussed return precautions.  Pt is hemodynamically stable & in NAD prior to discharge. Patient advised to follow up with primary care provider if symptoms do not resolve within 1 week. Discussed patient with Dr. Silverio LayYao who is agreement with plan. Patient understands and agrees with the plan.   Final diagnoses:  Hematuria  Viral upper respiratory illness  Left-sided thoracic back pain        Emi Holeslexandra M Jasneet Schobert, PA-C 05/11/15 1938  Richardean Canalavid H Yao, MD 05/14/15 [phone]

## 2015-05-11 NOTE — Discharge Instructions (Signed)
Medications: Tessalon  Treatment: Please take Tessalon every 8 hours for cough. Stay hydrated and get plenty of rest.  Follow-up: Please follow-up with your primary care doctor or return to the emergency department if your symptoms are worsening or not improving over the next week. Please return to emergency department feeling trouble breathing, develop chest pain, or any other new or concerning symptoms.   Upper Respiratory Infection, Adult Most upper respiratory infections (URIs) are a viral infection of the air passages leading to the lungs. A URI affects the nose, throat, and upper air passages. The most common type of URI is nasopharyngitis and is typically referred to as "the common cold." URIs run their course and usually go away on their own. Most of the time, a URI does not require medical attention, but sometimes a bacterial infection in the upper airways can follow a viral infection. This is called a secondary infection. Sinus and middle ear infections are common types of secondary upper respiratory infections. Bacterial pneumonia can also complicate a URI. A URI can worsen asthma and chronic obstructive pulmonary disease (COPD). Sometimes, these complications can require emergency medical care and may be life threatening.  CAUSES Almost all URIs are caused by viruses. A virus is a type of germ and can spread from one person to another.  RISKS FACTORS You may be at risk for a URI if:   You smoke.   You have chronic heart or lung disease.  You have a weakened defense (immune) system.   You are very young or very old.   You have nasal allergies or asthma.  You work in crowded or poorly ventilated areas.  You work in health care facilities or schools. SIGNS AND SYMPTOMS  Symptoms typically develop 2-3 days after you come in contact with a cold virus. Most viral URIs last 7-10 days. However, viral URIs from the influenza virus (flu virus) can last 14-18 days and are typically  more severe. Symptoms may include:   Runny or stuffy (congested) nose.   Sneezing.   Cough.   Sore throat.   Headache.   Fatigue.   Fever.   Loss of appetite.   Pain in your forehead, behind your eyes, and over your cheekbones (sinus pain).  Muscle aches.  DIAGNOSIS  Your health care provider may diagnose a URI by:  Physical exam.  Tests to check that your symptoms are not due to another condition such as:  Strep throat.  Sinusitis.  Pneumonia.  Asthma. TREATMENT  A URI goes away on its own with time. It cannot be cured with medicines, but medicines may be prescribed or recommended to relieve symptoms. Medicines may help:  Reduce your fever.  Reduce your cough.  Relieve nasal congestion. HOME CARE INSTRUCTIONS   Take medicines only as directed by your health care provider.   Gargle warm saltwater or take cough drops to comfort your throat as directed by your health care provider.  Use a warm mist humidifier or inhale steam from a shower to increase air moisture. This may make it easier to breathe.  Drink enough fluid to keep your urine clear or pale yellow.   Eat soups and other clear broths and maintain good nutrition.   Rest as needed.   Return to work when your temperature has returned to normal or as your health care provider advises. You may need to stay home longer to avoid infecting others. You can also use a face mask and careful hand washing to prevent spread of  the virus.  Increase the usage of your inhaler if you have asthma.   Do not use any tobacco products, including cigarettes, chewing tobacco, or electronic cigarettes. If you need help quitting, ask your health care provider. PREVENTION  The best way to protect yourself from getting a cold is to practice good hygiene.   Avoid oral or hand contact with people with cold symptoms.   Wash your hands often if contact occurs.  There is no clear evidence that vitamin C,  vitamin E, echinacea, or exercise reduces the chance of developing a cold. However, it is always recommended to get plenty of rest, exercise, and practice good nutrition.  SEEK MEDICAL CARE IF:   You are getting worse rather than better.   Your symptoms are not controlled by medicine.   You have chills.  You have worsening shortness of breath.  You have brown or red mucus.  You have yellow or brown nasal discharge.  You have pain in your face, especially when you bend forward.  You have a fever.  You have swollen neck glands.  You have pain while swallowing.  You have white areas in the back of your throat. SEEK IMMEDIATE MEDICAL CARE IF:   You have severe or persistent:  Headache.  Ear pain.  Sinus pain.  Chest pain.  You have chronic lung disease and any of the following:  Wheezing.  Prolonged cough.  Coughing up blood.  A change in your usual mucus.  You have a stiff neck.  You have changes in your:  Vision.  Hearing.  Thinking.  Mood. MAKE SURE YOU:   Understand these instructions.  Will watch your condition.  Will get help right away if you are not doing well or get worse.   This information is not intended to replace advice given to you by your health care provider. Make sure you discuss any questions you have with your health care provider.   Document Released: 07/20/2000 Document Revised: 06/10/2014 Document Reviewed: 05/01/2013 Elsevier Interactive Patient Education [employer]

## 2015-05-11 NOTE — ED Notes (Signed)
Pt reports recent sinus congestion and headache, thinks he has sinus infection. Now also having left side back pain since Friday, pt points to left flank area. Denies any urinary symptoms.

## 2015-05-14 ENCOUNTER — Telehealth: Payer: Self-pay | Admitting: Emergency Medicine

## 2015-05-15 ENCOUNTER — Emergency Department (HOSPITAL_COMMUNITY)
Admission: EM | Admit: 2015-05-15 | Discharge: 2015-05-15 | Disposition: A | Discharge status: Home / Self Care | Payer: BLUE CROSS/BLUE SHIELD | Attending: Emergency Medicine | Admitting: Emergency Medicine

## 2015-05-15 ENCOUNTER — Emergency Department (HOSPITAL_COMMUNITY): Payer: BLUE CROSS/BLUE SHIELD

## 2015-05-15 ENCOUNTER — Encounter (HOSPITAL_COMMUNITY): Payer: Self-pay | Admitting: Emergency Medicine

## 2015-05-15 DIAGNOSIS — J159 Unspecified bacterial pneumonia: Secondary | ICD-10-CM | POA: Diagnosis not present

## 2015-05-15 DIAGNOSIS — Z79899 Other long term (current) drug therapy: Secondary | ICD-10-CM | POA: Insufficient documentation

## 2015-05-15 DIAGNOSIS — R109 Unspecified abdominal pain: Secondary | ICD-10-CM | POA: Diagnosis present

## 2015-05-15 DIAGNOSIS — R000 Tachycardia, unspecified: Secondary | ICD-10-CM | POA: Insufficient documentation

## 2015-05-15 DIAGNOSIS — J189 Pneumonia, unspecified organism: Secondary | ICD-10-CM

## 2015-05-15 LAB — COMPREHENSIVE METABOLIC PANEL
ALBUMIN: 3.1 g/dL — AB (ref 3.5–5.0)
ALK PHOS: 43 U/L (ref 38–126)
ALT: 30 U/L (ref 17–63)
ANION GAP: 13 (ref 5–15)
AST: 51 U/L — ABNORMAL HIGH (ref 15–41)
BILIRUBIN TOTAL: 3 mg/dL — AB (ref 0.3–1.2)
BUN: 17 mg/dL (ref 6–20)
CALCIUM: 8.8 mg/dL — AB (ref 8.9–10.3)
CO2: 23 mmol/L (ref 22–32)
Chloride: 93 mmol/L — ABNORMAL LOW (ref 101–111)
Creatinine, Ser: 1.16 mg/dL (ref 0.61–1.24)
GFR calc Af Amer: >60 mL/min (ref >60)
GFR calc non Af Amer: >60 mL/min (ref >60)
GLUCOSE: 119 mg/dL — AB (ref 65–99)
Potassium: 3.6 mmol/L (ref 3.5–5.1)
Sodium: 129 mmol/L — ABNORMAL LOW (ref 135–145)
TOTAL PROTEIN: 6.6 g/dL (ref 6.5–8.1)

## 2015-05-15 LAB — CBC
HCT: 42.4 % (ref 39.0–52.0)
HEMOGLOBIN: 15 g/dL (ref 13.0–17.0)
MCH: 31.1 pg (ref 26.0–34.0)
MCHC: 35.4 g/dL (ref 30.0–36.0)
MCV: 87.8 fL (ref 78.0–100.0)
Platelets: 120 10*3/uL — ABNORMAL LOW (ref 150–400)
RBC: 4.83 MIL/uL (ref 4.22–5.81)
RDW: 12 % (ref 11.5–15.5)
WBC: 9.8 10*3/uL (ref 4.0–10.5)

## 2015-05-15 LAB — URINALYSIS, ROUTINE W REFLEX MICROSCOPIC
Glucose, UA: NEGATIVE mg/dL
KETONES UR: 40 mg/dL — AB
Leukocytes, UA: NEGATIVE
NITRITE: NEGATIVE
PROTEIN: 100 mg/dL — AB
SPECIFIC GRAVITY, URINE: 1.025 (ref 1.005–1.030)
pH: 6 (ref 5.0–8.0)

## 2015-05-15 LAB — URINE MICROSCOPIC-ADD ON

## 2015-05-15 LAB — LIPASE, BLOOD: Lipase: 18 U/L (ref 11–51)

## 2015-05-15 MED ORDER — HYDROCODONE-ACETAMINOPHEN 5-325 MG PO TABS: 1.0000 | ORAL_TABLET | Freq: Four times a day (QID) | ORAL | Status: DC | PRN

## 2015-05-15 MED ORDER — NAPROXEN 500 MG PO TABS: 500.0000 mg | ORAL_TABLET | Freq: Two times a day (BID) | ORAL | Status: DC

## 2015-05-15 MED ORDER — KETOROLAC TROMETHAMINE 30 MG/ML IJ SOLN
30.0000 mg | Freq: Once | INTRAMUSCULAR | Status: AC
  Administered 2015-05-15: 30 mg via INTRAVENOUS
  Filled 2015-05-15: qty 1

## 2015-05-15 MED ORDER — SODIUM CHLORIDE 0.9 % IV BOLUS (SEPSIS)
1000.0000 mL | Freq: Once | INTRAVENOUS | Status: AC
  Administered 2015-05-15: 1000 mL via INTRAVENOUS

## 2015-05-15 MED ORDER — MORPHINE SULFATE (PF) 4 MG/ML IV SOLN
6.0000 mg | Freq: Once | INTRAVENOUS | Status: AC
Start: 2015-05-15 — End: 2015-05-15
  Administered 2015-05-15: 6 mg via INTRAVENOUS
  Filled 2015-05-15: qty 2

## 2015-05-15 MED ORDER — CEFTRIAXONE SODIUM 1 G IJ SOLR
1.0000 g | Freq: Once | INTRAMUSCULAR | Status: AC
  Administered 2015-05-15: 1 g via INTRAVENOUS
  Filled 2015-05-15: qty 10

## 2015-05-15 MED ORDER — SODIUM CHLORIDE 0.9 % IV SOLN
INTRAVENOUS | Status: DC
  Administered 2015-05-15: 08:00:00 via INTRAVENOUS

## 2015-05-15 MED ORDER — AZITHROMYCIN 250 MG PO TABS: 250.0000 mg | ORAL_TABLET | Freq: Every day | ORAL | Status: DC

## 2015-05-15 NOTE — ED Provider Notes (Signed)
CSN: [id number]     Arrival date & time 05/15/15  0423 History   First MD Initiated Contact with Patient 05/15/15 0534     Chief Complaint  Patient presents with  . Flank Pain     (Consider location/radiation/quality/duration/timing/severity/associated sxs/prior Treatment) HPI  Terry Wells is a 45 y.o. male with no second past medical history presenting today with right flank pain. States it has been going on for the past 3 days. Pain is sharp and does not radiate. He has had no nausea or vomiting. He denies diarrhea. He states that his urine has turned darker brown recently. He denies any previous history of kidney problems. Patient has no further complaints.  10 Systems reviewed and are negative for acute change except as noted in the HPI.    History reviewed. No pertinent past medical history. Past Surgical History  Procedure Laterality Date  . Wrist surgery     No family history on file. Social History  Substance Use Topics  . Smoking status: Never Smoker   . Smokeless tobacco: None  . Alcohol Use: Yes    Review of Systems    Allergies  Review of patient's allergies indicates no known allergies.  Home Medications   Prior to Admission medications   Medication Sig Start Date End Date Taking? Authorizing Provider  benzonatate (TESSALON) 100 MG capsule Take 1 capsule (100 mg total) by mouth every 8 (eight) hours. 05/11/15  Yes Alexandra M Law, PA-C  ibuprofen (ADVIL,MOTRIN) 200 MG tablet Take 400-600 mg by mouth every 6 (six) hours as needed for mild pain.   Yes Historical Provider, MD  loratadine-pseudoephedrine (CLARITIN-D 24-HOUR) 10-240 MG 24 hr tablet Take 1 tablet by mouth daily.   Yes Historical Provider, MD  amoxicillin (AMOXIL) 500 MG capsule Take 1 capsule (500 mg total) by mouth 3 (three) times daily. Patient not taking: Reported on 05/11/2015 01/18/15   Joni Reining Pisciotta, PA-C  HYDROcodone-acetaminophen (NORCO/VICODIN) 5-325 MG per tablet Take 1-2 tablets by  mouth every 4 (four) hours as needed for moderate pain or severe pain. Patient not taking: Reported on 05/11/2015 02/19/14   Emilia Beck, PA-C  predniSONE (DELTASONE) 20 MG tablet Take 2 tablets (40 mg total) by mouth daily. Patient not taking: Reported on 05/11/2015 01/18/15   Joni Reining Pisciotta, PA-C   BP 122/87 mmHg  Pulse 118  Temp(Src) 98.2 F (36.8 C) (Oral)  Resp 20  Ht  (1.88 m)  Wt 247 lb 7 oz (112.237 kg)  BMI 31.76 kg/m2  SpO2 94% Physical Exam  Constitutional: He is oriented to person, place, and time. Vital signs are normal. He appears well-developed and well-nourished.  Non-toxic appearance. He does not appear ill. No distress.  HENT:  Head: Normocephalic and atraumatic.  Nose: Nose normal.  Mouth/Throat: Oropharynx is clear and moist. No oropharyngeal exudate.  Eyes: Conjunctivae and EOM are normal. Pupils are equal, round, and reactive to light. No scleral icterus.  Neck: Normal range of motion. Neck supple. No tracheal deviation, no edema, no erythema and normal range of motion present. No thyroid mass and no thyromegaly present.  Cardiovascular: Regular rhythm, S1 normal, S2 normal, normal heart sounds, intact distal pulses and normal pulses.  Exam reveals no gallop and no friction rub.   No murmur heard. Tachycardic  Pulmonary/Chest: Effort normal and breath sounds normal. No respiratory distress. He has no wheezes. He has no rhonchi. He has no rales.  Abdominal: Soft. Normal appearance and bowel sounds are normal. He exhibits no distension, no  ascites and no mass. There is no hepatosplenomegaly. There is no tenderness. There is no rebound, no guarding and no CVA tenderness.  Musculoskeletal: Normal range of motion. He exhibits no edema or tenderness.  Lymphadenopathy:    He has no cervical adenopathy.  Neurological: He is alert and oriented to person, place, and time. He has normal strength. No cranial nerve deficit or sensory deficit.  Skin: Skin is warm, dry  and intact. No petechiae and no rash noted. He is not diaphoretic. No erythema. No pallor.  Psychiatric: He has a normal mood and affect. His behavior is normal. Judgment normal.  Nursing note and vitals reviewed.   ED Course  Procedures (including critical care time) Labs Review Labs Reviewed  COMPREHENSIVE METABOLIC PANEL - Abnormal; Notable for the following:    Sodium 129 (*)    Chloride 93 (*)    Glucose, Bld 119 (*)    Calcium 8.8 (*)    Albumin 3.1 (*)    AST 51 (*)    Total Bilirubin 3.0 (*)    All other components within normal limits  CBC - Abnormal; Notable for the following:    Platelets 120 (*)    All other components within normal limits  URINALYSIS, ROUTINE W REFLEX MICROSCOPIC (NOT AT [facility]) - Abnormal; Notable for the following:    Color, Urine ORANGE (*)    APPearance CLOUDY (*)    Hgb urine dipstick LARGE (*)    Bilirubin Urine SMALL (*)    Ketones, ur 40 (*)    Protein, ur 100 (*)    All other components within normal limits  URINE MICROSCOPIC-ADD ON - Abnormal; Notable for the following:    Squamous Epithelial / LPF 0-5 (*)    Bacteria, UA FEW (*)    All other components within normal limits  LIPASE, BLOOD    Imaging Review No results found. I have personally reviewed and evaluated these images and lab results as part of my medical decision-making.   EKG Interpretation None      MDM   Final diagnoses:  Flank pain      Patient presents emergency department for right-sided flank pain. He has also had dark urine. possible kidney stone. He was given IV fluids, morphine, Toradol for his symptoms. CT scan is currently pending. Patient was signed out to oncoming provider for follow-up of CT scan.   Tomasita CrumbleAdeleke Shamyra Farias, MD 05/15/15 [phone]

## 2015-05-15 NOTE — ED Provider Notes (Signed)
Results for orders placed or performed during the hospital encounter of 05/15/15  Lipase, blood  Result Value Ref Range   Lipase 18 11 - 51 U/L  Comprehensive metabolic panel  Result Value Ref Range   Sodium 129 (L) 135 - 145 mmol/L   Potassium 3.6 3.5 - 5.1 mmol/L   Chloride 93 (L) 101 - 111 mmol/L   CO2 23 22 - 32 mmol/L   Glucose, Bld 119 (H) 65 - 99 mg/dL   BUN 17 6 - 20 mg/dL   Creatinine, Ser 1.191.16 0.61 - 1.24 mg/dL   Calcium 8.8 (L) 8.9 - 10.3 mg/dL   Total Protein 6.6 6.5 - 8.1 g/dL   Albumin 3.1 (L) 3.5 - 5.0 g/dL   AST 51 (H) 15 - 41 U/L   ALT 30 17 - 63 U/L   Alkaline Phosphatase 43 38 - 126 U/L   Total Bilirubin 3.0 (H) 0.3 - 1.2 mg/dL   GFR calc non Af Amer >60 >60 mL/min   GFR calc Af Amer >60 >60 mL/min   Anion gap 13 5 - 15  CBC  Result Value Ref Range   WBC 9.8 4.0 - 10.5 K/uL   RBC 4.83 4.22 - 5.81 MIL/uL   Hemoglobin 15.0 13.0 - 17.0 g/dL   HCT 14.742.4 82.939.0 - 56.252.0 %   MCV 87.8 78.0 - 100.0 fL   MCH 31.1 26.0 - 34.0 pg   MCHC 35.4 30.0 - 36.0 g/dL   RDW 13.012.0 86.511.5 - 78.415.5 %   Platelets 120 (L) 150 - 400 K/uL  Urinalysis, Routine w reflex microscopic ([facility])  Result Value Ref Range   Color, Urine ORANGE (A) YELLOW   APPearance CLOUDY (A) CLEAR   Specific Gravity, Urine 1.025 1.005 - 1.030   pH 6.0 5.0 - 8.0   Glucose, UA NEGATIVE NEGATIVE mg/dL   Hgb urine dipstick LARGE (A) NEGATIVE   Bilirubin Urine SMALL (A) NEGATIVE   Ketones, ur 40 (A) NEGATIVE mg/dL   Protein, ur 696100 (A) NEGATIVE mg/dL   Nitrite NEGATIVE NEGATIVE   Leukocytes, UA NEGATIVE NEGATIVE  Urine microscopic-add on  Result Value Ref Range   Squamous Epithelial / LPF 0-5 (A) NONE SEEN   WBC, UA 0-5 0 - 5 WBC/hpf   RBC / HPF 0-5 0 - 5 RBC/hpf   Bacteria, UA FEW (A) NONE SEEN   Urine-Other MUCOUS PRESENT    Dg Chest 2 View  05/15/2015  CLINICAL DATA:  Flank pain, shortness of Breath EXAM: CHEST  2 VIEW COMPARISON:  05/11/2015 FINDINGS: Cardio mediastinal silhouette is stable. There is  patchy airspace opacification in right upper lobe suprahilar region. Pneumonia cannot be excluded. Linear atelectasis or infiltrate in lingula. No pulmonary edema. There is patchy infiltrate/ pneumonia in right lower lobe posteriorly best seen on lateral view. Findings highly suspicious for multifocal pneumonia. Follow-up to resolution is recommended. IMPRESSION: There is patchy airspace opacification in right upper lobe suprahilar region. Pneumonia cannot be excluded. Linear atelectasis or infiltrate in lingula. No pulmonary edema. There is patchy infiltrate/ pneumonia in right lower lobe posteriorly best seen on lateral view. Findings highly suspicious for multifocal pneumonia. Followup PA and lateral chest X-ray is recommended in 3-4 weeks following trial of antibiotic therapy to ensure resolution and exclude underlying malignancy. Electronically Signed   By: Natasha MeadLiviu  Pop M.D.   On: 05/15/2015 08:18   Dg Chest 2 View  05/11/2015  CLINICAL DATA:  Sinus congestion, headache for 2 days EXAM: CHEST  2 VIEW  COMPARISON:  None. FINDINGS: Cardiomediastinal silhouette is unremarkable. No acute infiltrate or pleural effusion. No pulmonary edema. Mild basilar atelectasis. IMPRESSION: No active cardiopulmonary disease. Electronically Signed   By: Natasha Mead M.D.   On: 05/11/2015 18:30   Dg Abd 1 View  05/11/2015  CLINICAL DATA:  Back pain, left flank pain, hematuria EXAM: ABDOMEN - 1 VIEW COMPARISON:  None FINDINGS: There is nonobstructive small bowel gas pattern. Some colonic gas noted in right colon and transverse colon. No pathologic calcifications are noted. IMPRESSION: Nonobstructed small bowel gas pattern. Some colonic gas noted in proximal colon. No renal or ureteral calcifications. Electronically Signed   By: Natasha Mead M.D.   On: 05/11/2015 18:32   Ct Renal Stone Study  05/15/2015  CLINICAL DATA:  45 year old male with right flank pain and gross hematuria. Symptoms for 4 days. Initial encounter. EXAM: CT ABDOMEN  AND PELVIS WITHOUT CONTRAST TECHNIQUE: Multidetector CT imaging of the abdomen and pelvis was performed following the standard protocol without IV contrast. COMPARISON:  KUB 05/11/2015. FINDINGS: Confluent abnormal right lower lobe pulmonary opacity with consolidation. Associated distal airway thickening with some air bronchograms. Trace if any right pleural effusion. There is also mild patchy and nodular opacity in the left lingula and in the left posterior costophrenic angle. The right middle lobe is spared. No pericardial or left pleural effusion. No acute osseous abnormality identified. Incidental L5 spina bifida occulta (normal variant). No pelvic free fluid. Unremarkable urinary bladder. Decompressed distal colon. Mildly redundant but otherwise negative sigmoid colon. Negative left colon, transverse colon, right colon, appendix, terminal ileum, and other small bowel loops. Decompressed and negative stomach and duodenum. No abdominal free air or free fluid. Noncontrast liver, gallbladder, spleen, pancreas and adrenal glands are within normal limits. Mild calcified abdominal aortic atherosclerosis. Negative noncontrast appearance of the right kidney and right ureter. Left kidney and left ureter also appear normal. No urologic calculus. Tiny punctate pelvic phleboliths. No lymphadenopathy. IMPRESSION: 1. Right lower lobe pneumonia, with evidence of early spread of infection to the lingula and left lower lobe. No significant pleural effusion. Followup PA and lateral chest X-ray is recommended in 3-4 weeks following trial of antibiotic therapy to ensure resolution and exclude underlying malignancy. 2. Negative noncontrast Abdomen and Pelvis. No urologic calculus. Normal appendix. Electronically Signed   By: Odessa Fleming M.D.   On: 05/15/2015 07:22    Patient CT scan for renal stone showed evidence of pneumonia. On the right side were patient had flank pain. Some of this may be paralytic chest pain. Patient treated  with 1 g Rocephin some IV fluids for the low sodium and will be continued on Zithromax. Patient nontoxic no acute distress. Oxygen saturation saturations upper 90s. No fevers no hypotension. No evidence of any intra-abdominal process.  Vanetta Mulders, MD 05/15/15 1139

## 2015-05-15 NOTE — Discharge Instructions (Signed)
Community-Acquired Pneumonia, Adult °Pneumonia is an infection of the lungs. There are different types of pneumonia. One type can develop while a person is in a hospital. A different type, called community-acquired pneumonia, develops in people who are not, or have not recently been, in the hospital or other health care facility.  °CAUSES °Pneumonia may be caused by bacteria, viruses, or funguses. Community-acquired pneumonia is often caused by Streptococcus pneumonia bacteria. These bacteria are often passed from one person to another by breathing in droplets from the cough or sneeze of an infected person. °RISK FACTORS °The condition is more likely to develop in: °· People who have chronic diseases, such as chronic obstructive pulmonary disease (COPD), asthma, congestive heart failure, cystic fibrosis, diabetes, or kidney disease. °· People who have early-stage or late-stage HIV. °· People who have sickle cell disease. °· People who have had their spleen removed (splenectomy). °· People who have poor dental hygiene. °· People who have medical conditions that increase the risk of breathing in (aspirating) secretions their own mouth and nose.   °· People who have a weakened immune system (immunocompromised). °· People who smoke. °· People who travel to areas where pneumonia-causing germs commonly exist. °· People who are around animal habitats or animals that have pneumonia-causing germs, including birds, bats, rabbits, cats, and farm animals. °SYMPTOMS °Symptoms of this condition include: °· A dry cough. °· A wet (productive) cough. °· Fever. °· Sweating. °· Chest pain, especially when breathing deeply or coughing. °· Rapid breathing or difficulty breathing. °· Shortness of breath. °· Shaking chills. °· Fatigue. °· Muscle aches. °DIAGNOSIS °Your health care provider will take a medical history and perform a physical exam. You may also have other tests, including: °· Imaging studies of your chest, including  X-rays. °· Tests to check your blood oxygen level and other blood gases. °· Other tests on blood, mucus (sputum), fluid around your lungs (pleural fluid), and urine. °If your pneumonia is severe, other tests may be done to identify the specific cause of your illness. °TREATMENT °The type of treatment that you receive depends on many factors, such as the cause of your pneumonia, the medicines you take, and other medical conditions that you have. For most adults, treatment and recovery from pneumonia may occur at home. In some cases, treatment must happen in a hospital. Treatment may include: °· Antibiotic medicines, if the pneumonia was caused by bacteria. °· Antiviral medicines, if the pneumonia was caused by a virus. °· Medicines that are given by mouth or through an IV tube. °· Oxygen. °· Respiratory therapy. °Although rare, treating severe pneumonia may include: °· Mechanical ventilation. This is done if you are not breathing well on your own and you cannot maintain a safe blood oxygen level. °· Thoracentesis. This procedure removes fluid around one lung or both lungs to help you breathe better. °HOME CARE INSTRUCTIONS °· Take over-the-counter and prescription medicines only as told by your health care provider. °¨ Only take cough medicine if you are losing sleep. Understand that cough medicine can prevent your body's natural ability to remove mucus from your lungs. °¨ If you were prescribed an antibiotic medicine, take it as told by your health care provider. Do not stop taking the antibiotic even if you start to feel better. °· Sleep in a semi-upright position at night. Try sleeping in a reclining chair, or place a few pillows under your head. °· Do not use tobacco products, including cigarettes, chewing tobacco, and e-cigarettes. If you need help quitting, ask your health care provider. °· Drink enough water to keep your urine   clear or pale yellow. This will help to thin out mucus secretions in your  lungs. PREVENTION There are ways that you can decrease your risk of developing community-acquired pneumonia. Consider getting a pneumococcal vaccine if:  You are older than 44 years of age.  You are older than 44 years of age and are undergoing cancer treatment, have chronic lung disease, or have other medical conditions that affect your immune system. Ask your health care provider if this applies to you. There are different types and schedules of pneumococcal vaccines. Ask your health care provider which vaccination option is best for you. You may also prevent community-acquired pneumonia if you take these actions:  Get an influenza vaccine every year. Ask your health care provider which type of influenza vaccine is best for you.  Go to the dentist on a regular basis.  Wash your hands often. Use hand sanitizer if soap and water are not available. SEEK MEDICAL CARE IF:  You have a fever.  You are losing sleep because you cannot control your cough with cough medicine. SEEK IMMEDIATE MEDICAL CARE IF:  You have worsening shortness of breath.  You have increased chest pain.  Your sickness becomes worse, especially if you are an older adult or have a weakened immune system.  You cough up blood.   This information is not intended to replace advice given to you by your health care provider. Make sure you discuss any questions you have with your health care provider.  Physical rate consistent with pneumonia. Take the antibiotic Zithromax as directed for the next few days. Expect improvement in 2 days. Take the Naprosyn for the flank and chest discomfort. Can supplement with hydrocodone as needed. Return for any new or worse symptoms. Work note provided.   Document Released: 01/24/2005 Document Revised: 10/15/2014 Document Reviewed: 05/21/2014 Elsevier Interactive Patient Education [employer]2016 [employer]

## 2015-05-15 NOTE — ED Notes (Signed)
Patient arrives with complaint of right flank pain. States onset 3 days ago. Percussion over pain site drastically exacerbates pain. Palpation of area does not exacerbate pain. Patient endorses concentrated urine described as "brown". Denies history of kidney problems, UTI, or kidney stones.

## 2015-05-15 NOTE — ED Notes (Signed)
Pt given cup of water via order Deretha EmoryZackowski, MD

## 2015-05-15 NOTE — ED Notes (Signed)
Patient transported to CT 

## 2016-07-06 IMAGING — CT CT RENAL STONE PROTOCOL
2 of 4 series · 16 of 46 positions shown, 18 images · non-contrast
Comparison: KUB 05/11/2015.

CLINICAL DATA: 45-year-old male with right flank pain and gross
hematuria. Symptoms for 4 days. Initial encounter.

EXAM:
CT ABDOMEN AND PELVIS WITHOUT CONTRAST
TECHNIQUE: Multidetector CT imaging of the abdomen and pelvis was performed
following the standard protocol without IV contrast.

[Series 2: renal stone 5mm · axial · 0.98mm/px · z∈[-1304,-779]mm · 13 of 115 slices shown, 15 images]
[im 5/115  soft-tissue]
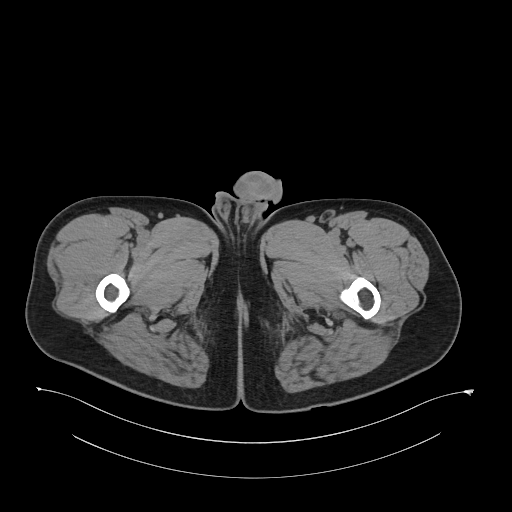
[im 5/115  bone]
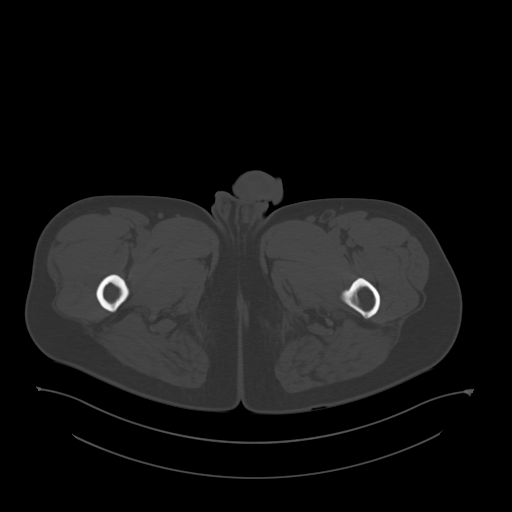
[im 14/115  soft-tissue]
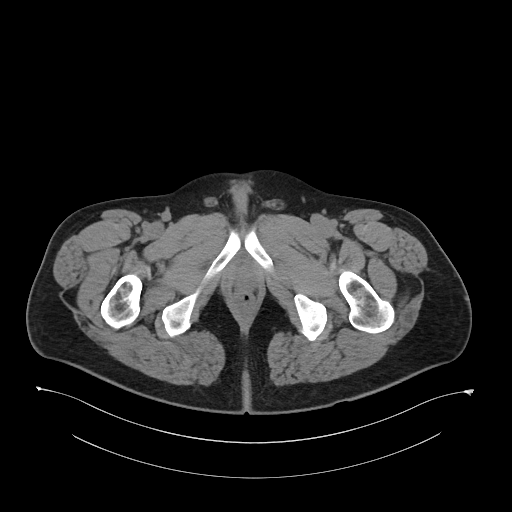
[im 22/115  soft-tissue]
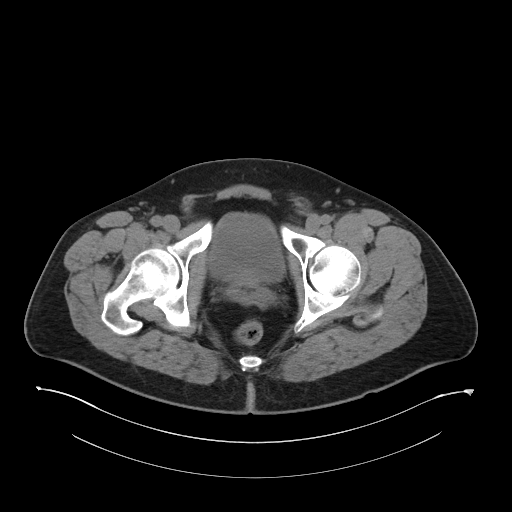
[im 31/115  soft-tissue]
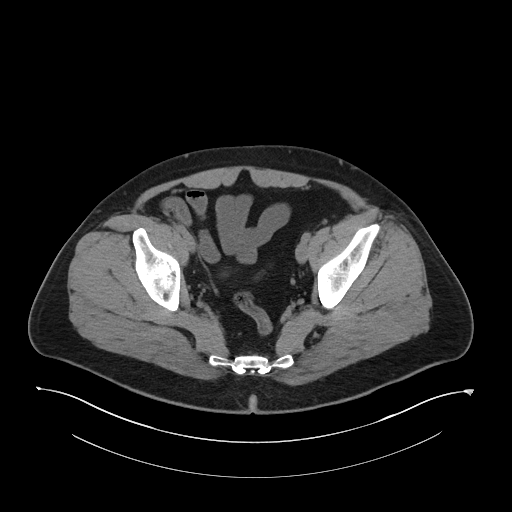
[im 40/115  soft-tissue]
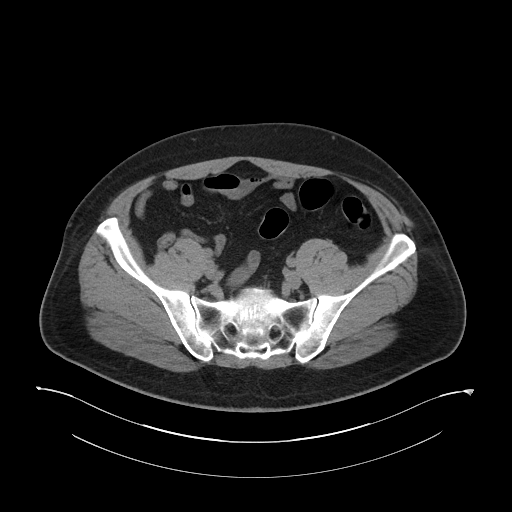
[im 49/115  soft-tissue]
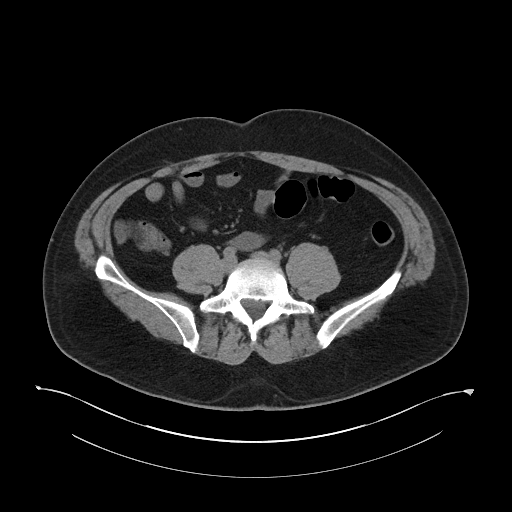
[im 58/115  soft-tissue]
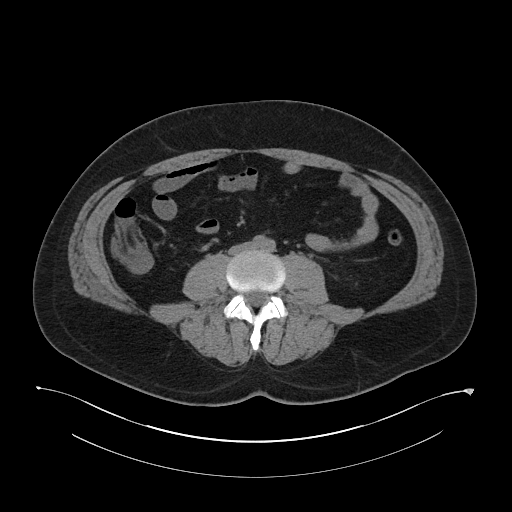
[im 66/115  soft-tissue]
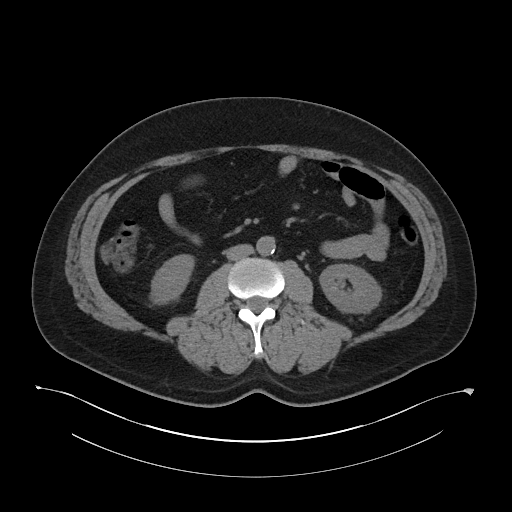
[im 75/115  soft-tissue]
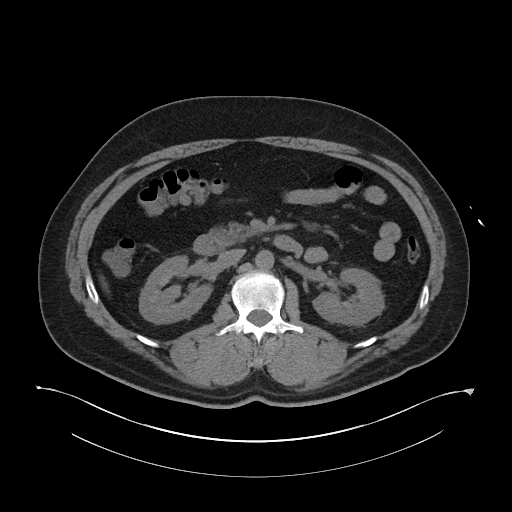
[im 75/115  bone]
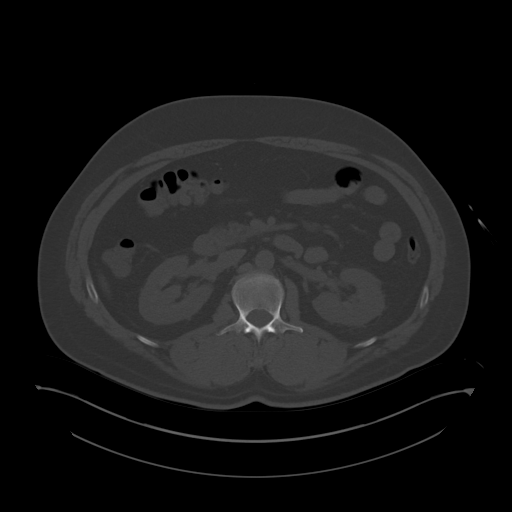
[im 84/115  soft-tissue]
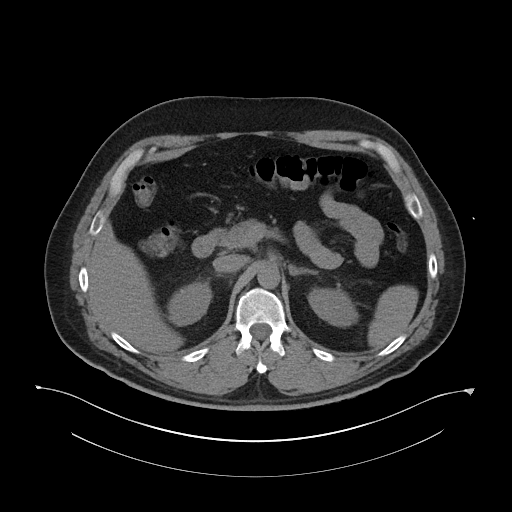
[im 93/115  soft-tissue]
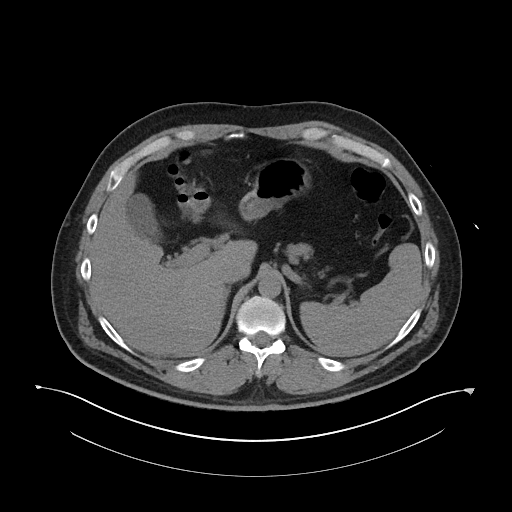
[im 101/115  soft-tissue]
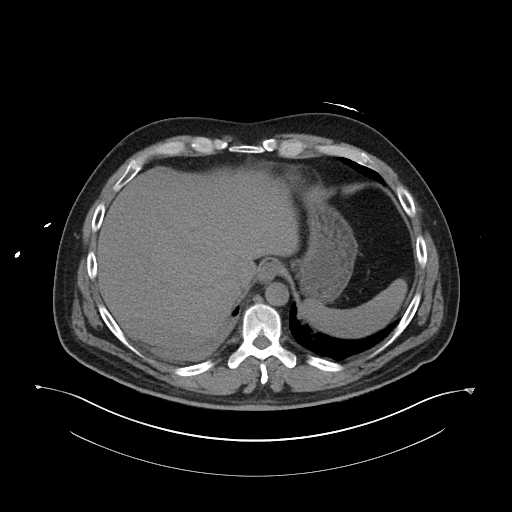
[im 110/115  soft-tissue]
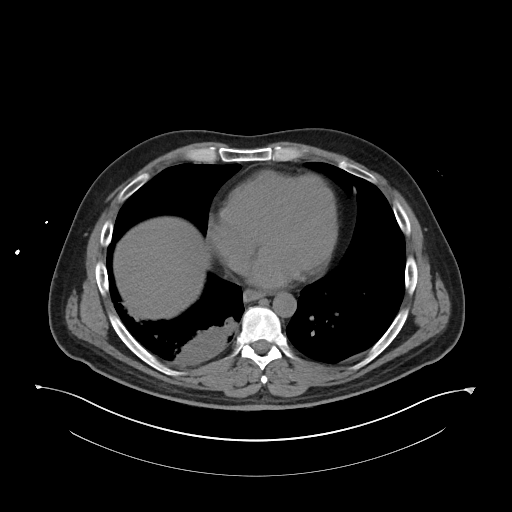

[Series 4: renal stone 3.0 cor · coronal · 0.87mm/px · 3 of 106 slices shown]
[im 47/106  soft-tissue]
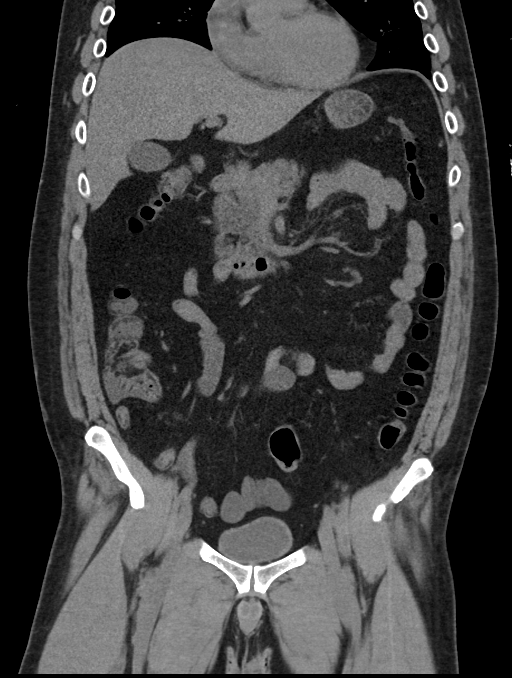
[im 59/106  soft-tissue]
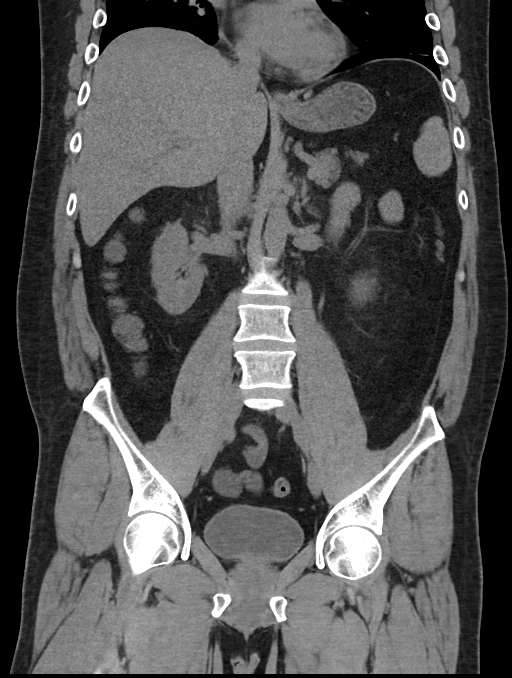
[im 71/106  soft-tissue]
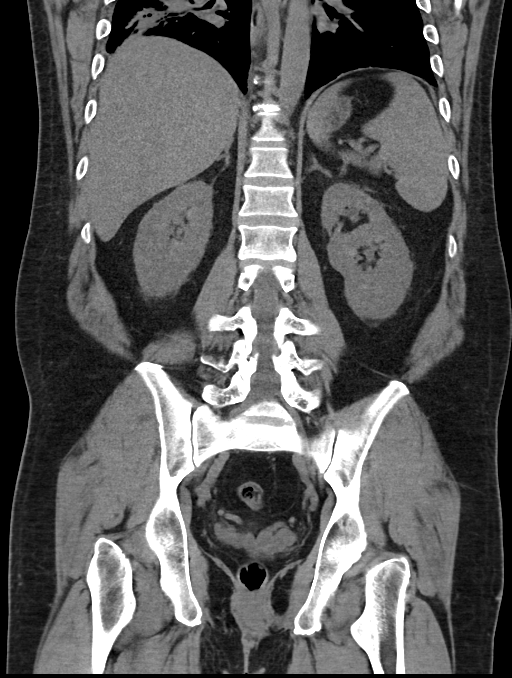

[16 of 46 positions shown; findings below may reference images not displayed]

FINDINGS: Confluent abnormal right lower lobe pulmonary opacity with
consolidation. Associated distal airway thickening with some air
bronchograms. Trace if any right pleural effusion.

There is also mild patchy and nodular opacity in the left lingula
and in the left posterior costophrenic angle. The right middle lobe
is spared. No pericardial or left pleural effusion.

No acute osseous abnormality identified. Incidental L5 spina bifida
occulta (normal variant).

No pelvic free fluid. Unremarkable urinary bladder. Decompressed
distal colon.

Mildly redundant but otherwise negative sigmoid colon. Negative left
colon, transverse colon, right colon, appendix, terminal ileum, and
other small bowel loops. Decompressed and negative stomach and
duodenum.

No abdominal free air or free fluid. Noncontrast liver, gallbladder,
spleen, pancreas and adrenal glands are within normal limits. Mild
calcified abdominal aortic atherosclerosis.

Negative noncontrast appearance of the right kidney and right
ureter. Left kidney and left ureter also appear normal. No urologic
calculus. Tiny punctate pelvic phleboliths. No lymphadenopathy.
IMPRESSION: 1. Right lower lobe pneumonia, with evidence of early spread of
infection to the lingula and left lower lobe. No significant pleural
effusion.
Followup PA and lateral chest X-ray is recommended in 3-4 weeks
following trial of antibiotic therapy to ensure resolution and
exclude underlying malignancy.
2. Negative noncontrast Abdomen and Pelvis. No urologic calculus.
Normal appendix.

## 2016-07-06 IMAGING — DX DG CHEST 2V
2 series · 2 of 2 positions shown · non-contrast
Comparison: 05/11/2015

CLINICAL DATA: Flank pain, shortness of Breath

EXAM:
CHEST  2 VIEW

[w chest pa]
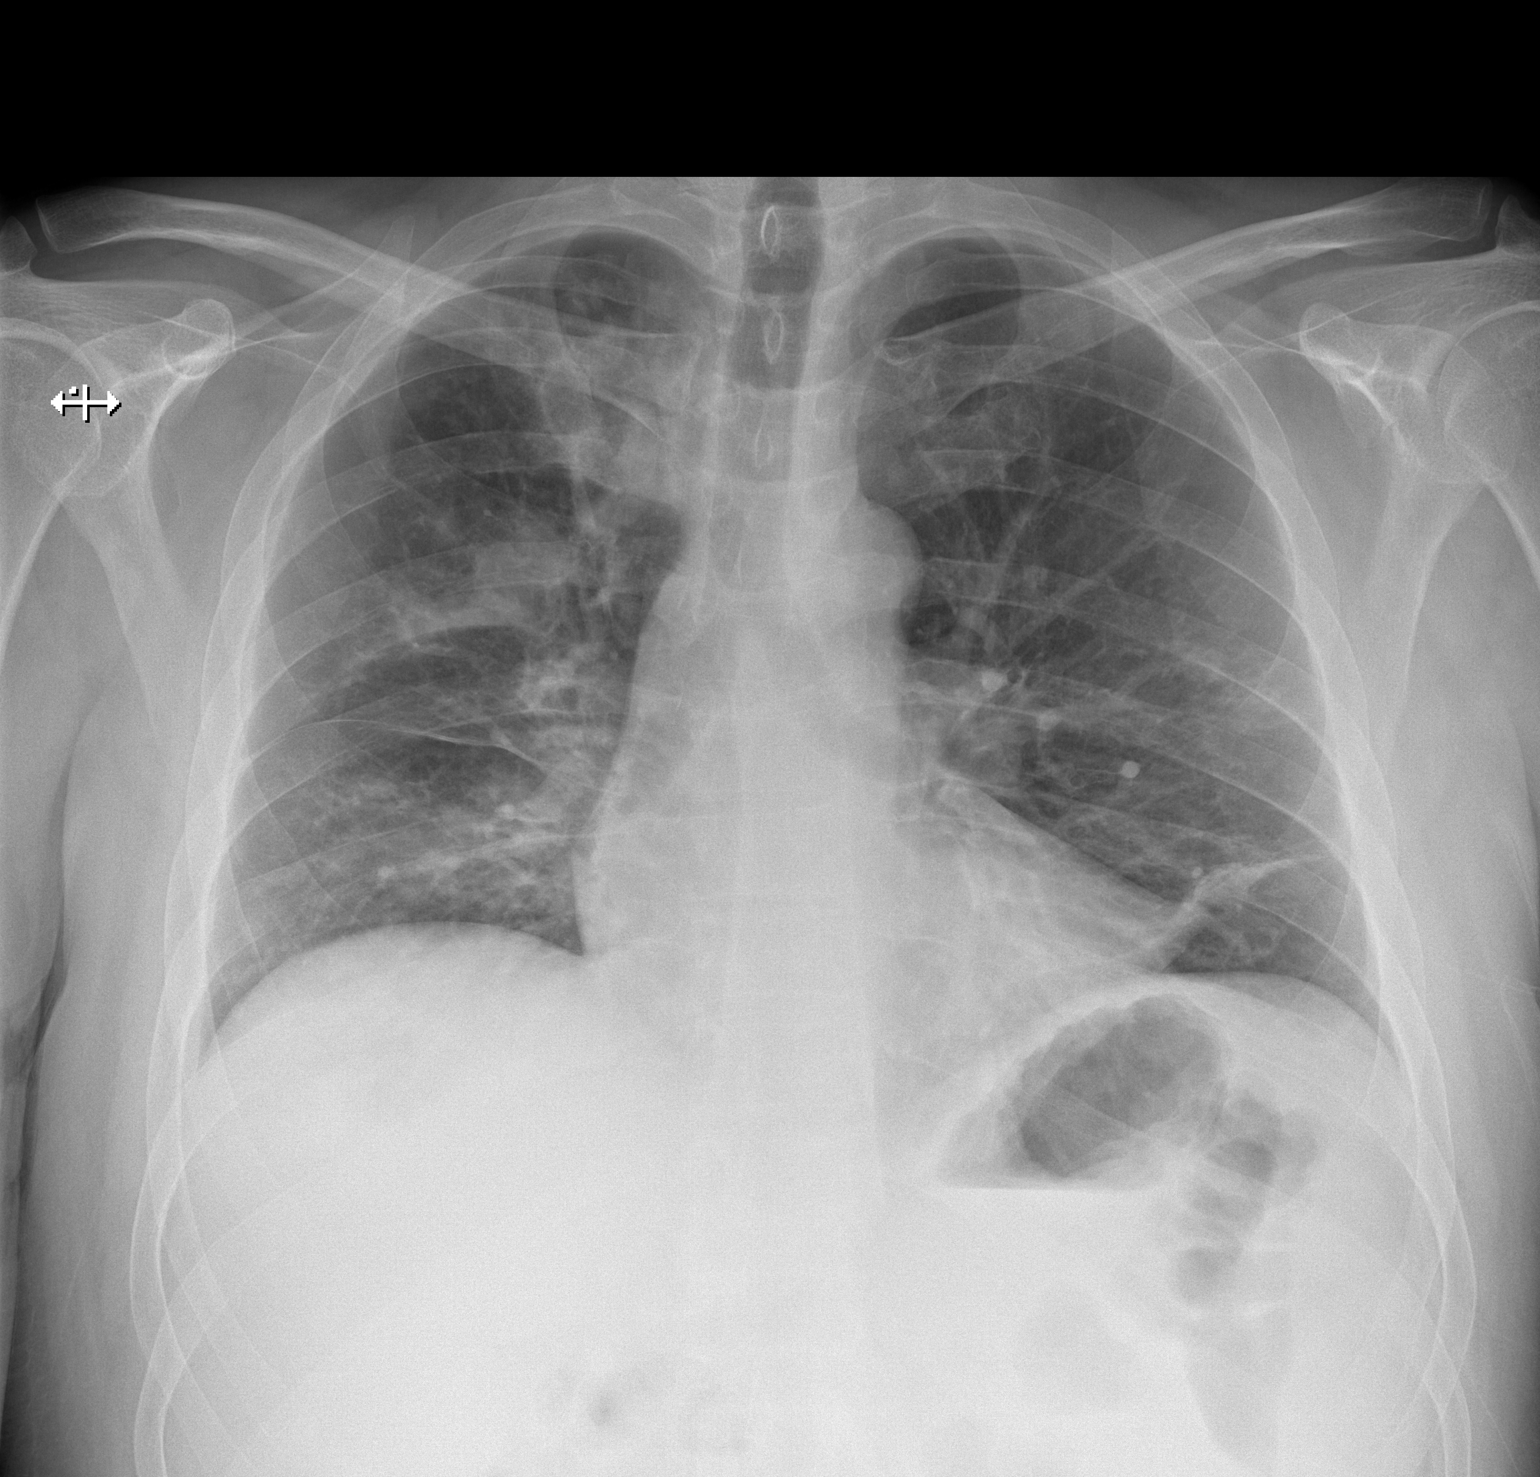

[w chest lat]
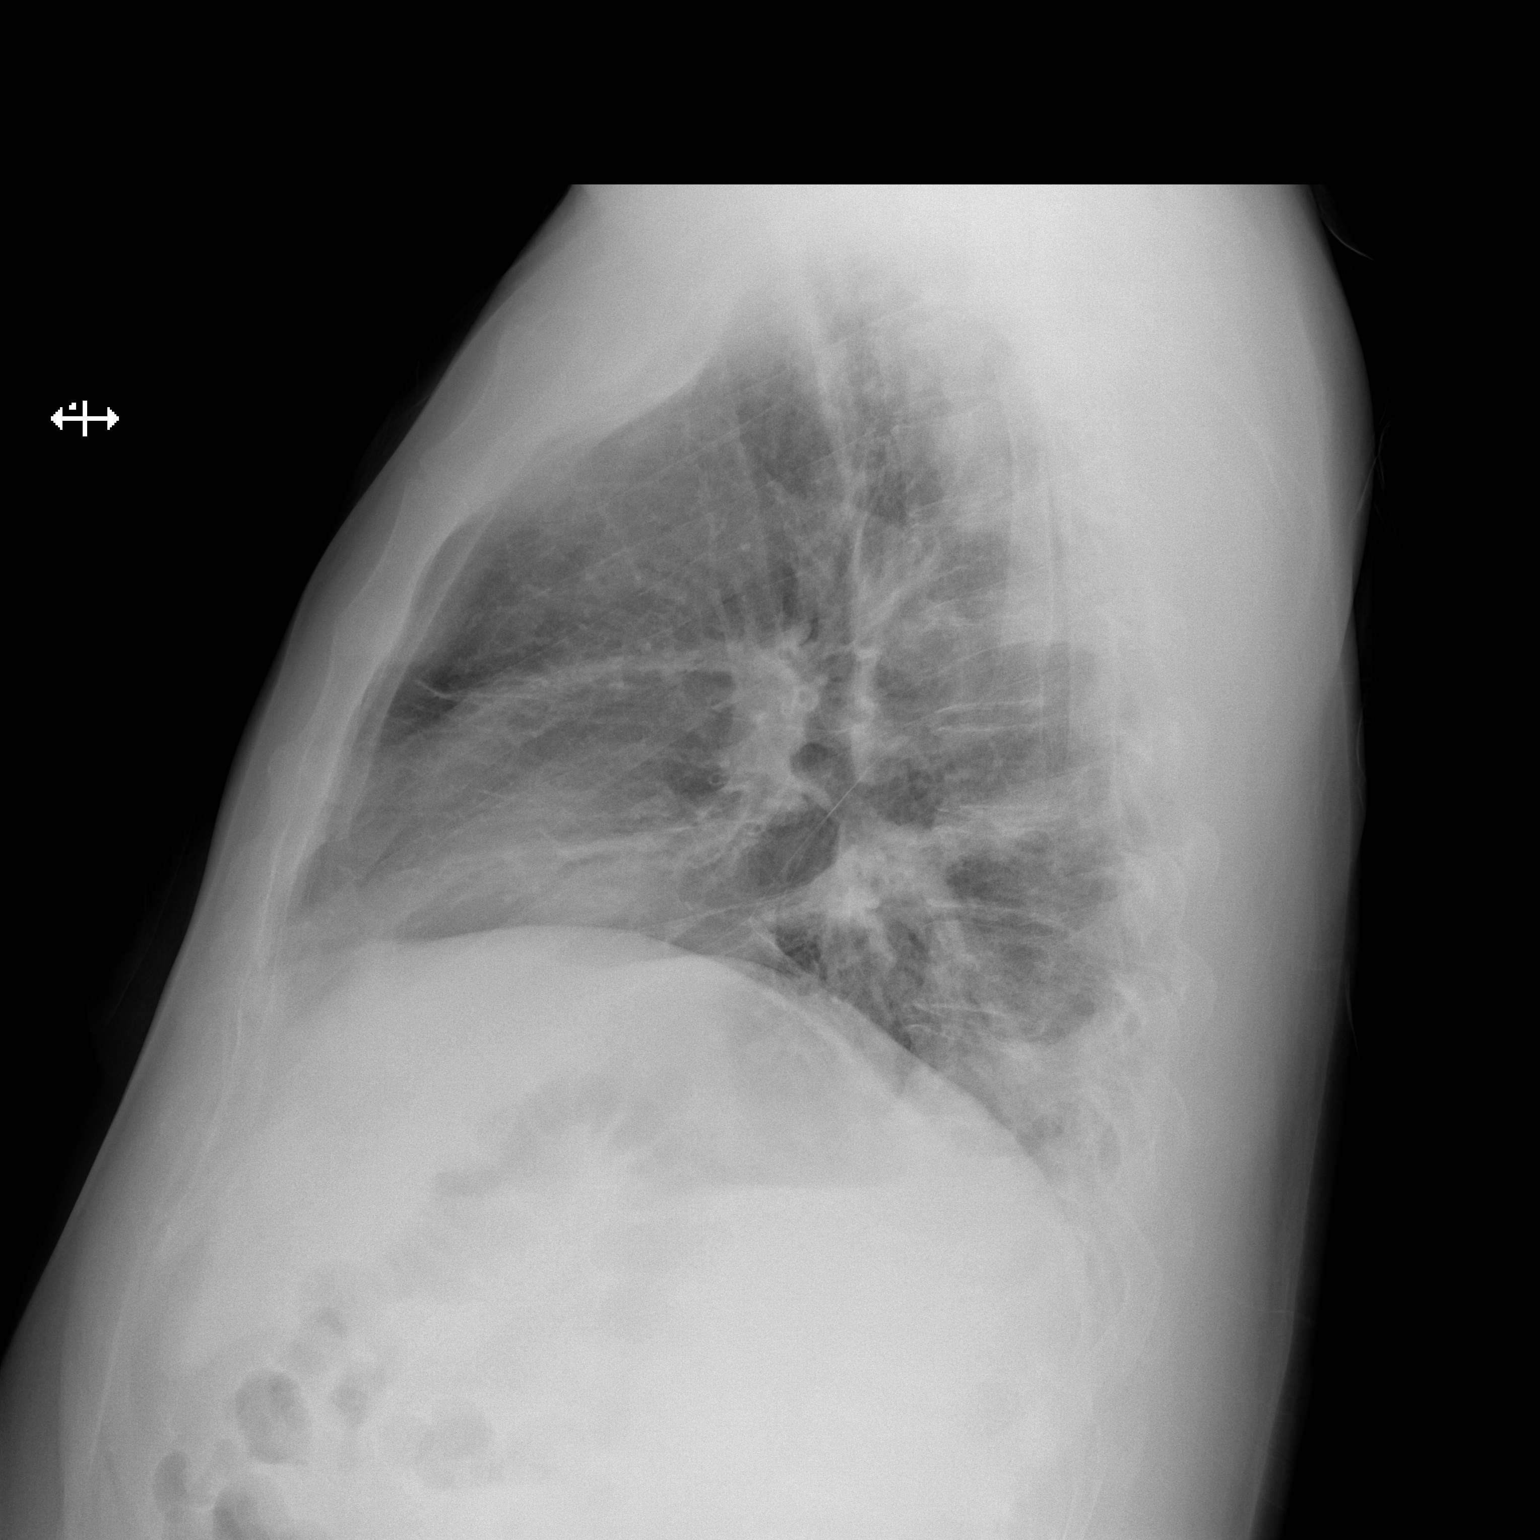

[2 of 2 positions shown; findings below may reference images not displayed]

FINDINGS: Cardio mediastinal silhouette is stable. There is patchy airspace
opacification in right upper lobe suprahilar region. Pneumonia
cannot be excluded. Linear atelectasis or infiltrate in lingula. No
pulmonary edema. There is patchy infiltrate/ pneumonia in right
lower lobe posteriorly best seen on lateral view. Findings highly
suspicious for multifocal pneumonia. Follow-up to resolution is
recommended.
IMPRESSION: There is patchy airspace opacification in right upper lobe
suprahilar region. Pneumonia cannot be excluded. Linear atelectasis
or infiltrate in lingula. No pulmonary edema. There is patchy
infiltrate/ pneumonia in right lower lobe posteriorly best seen on
lateral view. Findings highly suspicious for multifocal pneumonia.
Followup PA and lateral chest X-ray is recommended in 3-4 weeks
following trial of antibiotic therapy to ensure resolution and
exclude underlying malignancy.

## 2017-04-03 ENCOUNTER — Ambulatory Visit (HOSPITAL_COMMUNITY)
Admission: RE | Admit: 2017-04-03 | Discharge: 2017-04-03 | Disposition: A | Discharge status: Home / Self Care | Payer: Self-pay | Attending: Psychiatry | Admitting: Psychiatry

## 2017-04-03 ENCOUNTER — Encounter (HOSPITAL_COMMUNITY): Payer: Self-pay | Admitting: Emergency Medicine

## 2017-04-03 ENCOUNTER — Other Ambulatory Visit: Payer: Self-pay

## 2017-04-03 ENCOUNTER — Encounter (HOSPITAL_COMMUNITY): Payer: Self-pay

## 2017-04-03 ENCOUNTER — Emergency Department (HOSPITAL_COMMUNITY)
Admission: EM | Admit: 2017-04-03 | Discharge: 2017-04-03 | Disposition: A | Discharge status: Other Acute Inpatient Hospital | Payer: BLUE CROSS/BLUE SHIELD | Attending: Emergency Medicine | Admitting: Emergency Medicine

## 2017-04-03 ENCOUNTER — Inpatient Hospital Stay (HOSPITAL_COMMUNITY)
Admission: AD | Admit: 2017-04-03 | Discharge: 2017-04-06 | DRG: 885 | Disposition: A | Discharge status: Home / Self Care | Payer: Federal, State, Local not specified - Other | Source: Intra-hospital | Attending: Psychiatry | Admitting: Psychiatry

## 2017-04-03 DIAGNOSIS — Z046 Encounter for general psychiatric examination, requested by authority: Secondary | ICD-10-CM | POA: Insufficient documentation

## 2017-04-03 DIAGNOSIS — Z79899 Other long term (current) drug therapy: Secondary | ICD-10-CM | POA: Diagnosis not present

## 2017-04-03 DIAGNOSIS — G4700 Insomnia, unspecified: Secondary | ICD-10-CM | POA: Diagnosis present

## 2017-04-03 DIAGNOSIS — F419 Anxiety disorder, unspecified: Secondary | ICD-10-CM | POA: Diagnosis present

## 2017-04-03 DIAGNOSIS — F1010 Alcohol abuse, uncomplicated: Secondary | ICD-10-CM | POA: Insufficient documentation

## 2017-04-03 DIAGNOSIS — F322 Major depressive disorder, single episode, severe without psychotic features: Secondary | ICD-10-CM | POA: Diagnosis present

## 2017-04-03 DIAGNOSIS — F1020 Alcohol dependence, uncomplicated: Secondary | ICD-10-CM | POA: Diagnosis present

## 2017-04-03 DIAGNOSIS — Z811 Family history of alcohol abuse and dependence: Secondary | ICD-10-CM

## 2017-04-03 DIAGNOSIS — Z008 Encounter for other general examination: Secondary | ICD-10-CM

## 2017-04-03 DIAGNOSIS — Z23 Encounter for immunization: Secondary | ICD-10-CM

## 2017-04-03 DIAGNOSIS — R45851 Suicidal ideations: Secondary | ICD-10-CM | POA: Insufficient documentation

## 2017-04-03 DIAGNOSIS — I10 Essential (primary) hypertension: Secondary | ICD-10-CM

## 2017-04-03 DIAGNOSIS — F1011 Alcohol abuse, in remission: Secondary | ICD-10-CM

## 2017-04-03 LAB — CBC
HCT: 47.6 % (ref 39.0–52.0)
Hemoglobin: 16.8 g/dL (ref 13.0–17.0)
MCH: 32.2 pg (ref 26.0–34.0)
MCHC: 35.3 g/dL (ref 30.0–36.0)
MCV: 91.4 fL (ref 78.0–100.0)
PLATELETS: 175 10*3/uL (ref 150–400)
RBC: 5.21 MIL/uL (ref 4.22–5.81)
RDW: 12 % (ref 11.5–15.5)
WBC: 4.6 10*3/uL (ref 4.0–10.5)

## 2017-04-03 LAB — COMPREHENSIVE METABOLIC PANEL
ALT: 40 U/L (ref 17–63)
AST: 26 U/L (ref 15–41)
Albumin: 4.1 g/dL (ref 3.5–5.0)
Alkaline Phosphatase: 56 U/L (ref 38–126)
Anion gap: 10 (ref 5–15)
BUN: 11 mg/dL (ref 6–20)
CHLORIDE: 106 mmol/L (ref 101–111)
CO2: 20 mmol/L — ABNORMAL LOW (ref 22–32)
CREATININE: 0.94 mg/dL (ref 0.61–1.24)
Calcium: 9.1 mg/dL (ref 8.9–10.3)
Glucose, Bld: 94 mg/dL (ref 65–99)
POTASSIUM: 4.1 mmol/L (ref 3.5–5.1)
Sodium: 136 mmol/L (ref 135–145)
TOTAL PROTEIN: 7.4 g/dL (ref 6.5–8.1)
Total Bilirubin: 1.9 mg/dL — ABNORMAL HIGH (ref 0.3–1.2)

## 2017-04-03 LAB — ACETAMINOPHEN LEVEL: Acetaminophen (Tylenol), Serum: <10 ug/mL — ABNORMAL LOW (ref 10–30)

## 2017-04-03 LAB — URINALYSIS, ROUTINE W REFLEX MICROSCOPIC
BACTERIA UA: NONE SEEN
BILIRUBIN URINE: NEGATIVE
GLUCOSE, UA: NEGATIVE mg/dL
KETONES UR: NEGATIVE mg/dL
LEUKOCYTES UA: NEGATIVE
NITRITE: NEGATIVE
PROTEIN: NEGATIVE mg/dL
Specific Gravity, Urine: 1.009 (ref 1.005–1.030)
Squamous Epithelial / LPF: NONE SEEN
pH: 6 (ref 5.0–8.0)

## 2017-04-03 LAB — ETHANOL

## 2017-04-03 LAB — SALICYLATE LEVEL

## 2017-04-03 LAB — RAPID URINE DRUG SCREEN, HOSP PERFORMED
AMPHETAMINES: NOT DETECTED
Barbiturates: NOT DETECTED
Benzodiazepines: NOT DETECTED
Cocaine: NOT DETECTED
OPIATES: NOT DETECTED
Tetrahydrocannabinol: NOT DETECTED

## 2017-04-03 MED ORDER — THIAMINE HCL 100 MG/ML IJ SOLN: 100.0000 mg | Freq: Every day | INTRAMUSCULAR | Status: DC

## 2017-04-03 MED ORDER — THIAMINE HCL 100 MG/ML IJ SOLN
100.0000 mg | Freq: Once | INTRAMUSCULAR | Status: DC
  Filled 2017-04-03: qty 2

## 2017-04-03 MED ORDER — HYDROCHLOROTHIAZIDE 12.5 MG PO CAPS: 25.0000 mg | ORAL_CAPSULE | Freq: Once | ORAL | Status: DC

## 2017-04-03 MED ORDER — LORAZEPAM 1 MG PO TABS
1.0000 mg | ORAL_TABLET | Freq: Four times a day (QID) | ORAL | Status: DC
  Administered 2017-04-03 – 2017-04-04 (×2): 1 mg via ORAL
  Filled 2017-04-03 (×2): qty 1

## 2017-04-03 MED ORDER — TRAZODONE HCL 50 MG PO TABS
50.0000 mg | ORAL_TABLET | Freq: Every evening | ORAL | Status: DC | PRN
  Administered 2017-04-05: 50 mg via ORAL
  Filled 2017-04-03: qty 1
  Filled 2017-04-03: qty 7
  Filled 2017-04-03: qty 1

## 2017-04-03 MED ORDER — LORAZEPAM 1 MG PO TABS
1.0000 mg | ORAL_TABLET | Freq: Two times a day (BID) | ORAL | Status: DC
Start: 2017-04-06 — End: 2017-04-04

## 2017-04-03 MED ORDER — MAGNESIUM HYDROXIDE 400 MG/5ML PO SUSP: 30.0000 mL | Freq: Every day | ORAL | Status: DC | PRN

## 2017-04-03 MED ORDER — LORAZEPAM 1 MG PO TABS: 0.0000 mg | ORAL_TABLET | Freq: Two times a day (BID) | ORAL | Status: DC

## 2017-04-03 MED ORDER — VITAMIN B-1 100 MG PO TABS
100.0000 mg | ORAL_TABLET | Freq: Every day | ORAL | Status: DC
  Administered 2017-04-04 – 2017-04-06 (×3): 100 mg via ORAL
  Filled 2017-04-03 (×5): qty 1

## 2017-04-03 MED ORDER — LISINOPRIL 10 MG PO TABS
10.0000 mg | ORAL_TABLET | Freq: Every day | ORAL | Status: DC
  Filled 2017-04-03: qty 1

## 2017-04-03 MED ORDER — LISINOPRIL 10 MG PO TABS: 10.0000 mg | ORAL_TABLET | Freq: Every day | ORAL | 0 refills | Status: DC

## 2017-04-03 MED ORDER — ADULT MULTIVITAMIN W/MINERALS CH
1.0000 | ORAL_TABLET | Freq: Every day | ORAL | Status: DC
  Administered 2017-04-04 – 2017-04-06 (×3): 1 via ORAL
  Filled 2017-04-03 (×5): qty 1

## 2017-04-03 MED ORDER — LISINOPRIL 10 MG PO TABS
10.0000 mg | ORAL_TABLET | Freq: Once | ORAL | Status: AC
  Administered 2017-04-03: 10 mg via ORAL
  Filled 2017-04-03: qty 1

## 2017-04-03 MED ORDER — ACETAMINOPHEN 325 MG PO TABS: 650.0000 mg | ORAL_TABLET | Freq: Four times a day (QID) | ORAL | Status: DC | PRN

## 2017-04-03 MED ORDER — LORAZEPAM 1 MG PO TABS
1.0000 mg | ORAL_TABLET | Freq: Four times a day (QID) | ORAL | Status: DC | PRN
Start: 2017-04-03 — End: 2017-04-04

## 2017-04-03 MED ORDER — LOPERAMIDE HCL 2 MG PO CAPS: 2.0000 mg | ORAL_CAPSULE | ORAL | Status: DC | PRN

## 2017-04-03 MED ORDER — LORAZEPAM 2 MG/ML IJ SOLN: 2.0000 mg | Freq: Once | INTRAMUSCULAR | Status: DC

## 2017-04-03 MED ORDER — VITAMIN B-1 100 MG PO TABS: 100.0000 mg | ORAL_TABLET | Freq: Every day | ORAL | Status: DC

## 2017-04-03 MED ORDER — ONDANSETRON 4 MG PO TBDP: 4.0000 mg | ORAL_TABLET | Freq: Four times a day (QID) | ORAL | Status: DC | PRN

## 2017-04-03 MED ORDER — HYDROXYZINE HCL 25 MG PO TABS
25.0000 mg | ORAL_TABLET | Freq: Four times a day (QID) | ORAL | Status: DC | PRN
  Administered 2017-04-04: 25 mg via ORAL
  Filled 2017-04-03: qty 10
  Filled 2017-04-03: qty 1

## 2017-04-03 MED ORDER — LORAZEPAM 1 MG PO TABS: 1.0000 mg | ORAL_TABLET | Freq: Three times a day (TID) | ORAL | Status: DC

## 2017-04-03 MED ORDER — LORAZEPAM 1 MG PO TABS
2.0000 mg | ORAL_TABLET | Freq: Once | ORAL | Status: AC
  Administered 2017-04-03: 2 mg via ORAL

## 2017-04-03 MED ORDER — ALUM & MAG HYDROXIDE-SIMETH 200-200-20 MG/5ML PO SUSP: 30.0000 mL | ORAL | Status: DC | PRN

## 2017-04-03 MED ORDER — INFLUENZA VAC SPLIT QUAD 0.5 ML IM SUSY
0.5000 mL | PREFILLED_SYRINGE | INTRAMUSCULAR | Status: AC
  Administered 2017-04-04: 0.5 mL via INTRAMUSCULAR
  Filled 2017-04-03: qty 0.5

## 2017-04-03 MED ORDER — ONDANSETRON HCL 4 MG PO TABS: 4.0000 mg | ORAL_TABLET | Freq: Three times a day (TID) | ORAL | Status: DC | PRN

## 2017-04-03 MED ORDER — LORAZEPAM 1 MG PO TABS: 1.0000 mg | ORAL_TABLET | Freq: Every day | ORAL | Status: DC

## 2017-04-03 MED ORDER — LORAZEPAM 2 MG/ML IJ SOLN: 0.0000 mg | Freq: Two times a day (BID) | INTRAMUSCULAR | Status: DC

## 2017-04-03 MED ORDER — LORAZEPAM 2 MG/ML IJ SOLN: 0.0000 mg | Freq: Four times a day (QID) | INTRAMUSCULAR | Status: DC

## 2017-04-03 MED ORDER — LORAZEPAM 1 MG PO TABS
0.0000 mg | ORAL_TABLET | Freq: Four times a day (QID) | ORAL | Status: DC
  Filled 2017-04-03: qty 2

## 2017-04-03 MED ORDER — ACETAMINOPHEN 325 MG PO TABS: 650.0000 mg | ORAL_TABLET | ORAL | Status: DC | PRN

## 2017-04-03 NOTE — ED Notes (Signed)
Patient wanded by Security. Has one belonging bag in triage cabinet.

## 2017-04-03 NOTE — BH Assessment (Signed)
[facility] Assessment Progress Note This Clinical research associatewriter per RN request [facility] inform [facility]'s latest set of vitals. AC reviewed and [id number].

## 2017-04-03 NOTE — Tx Team (Signed)
Initial Treatment Plan 04/03/2017 8:43 PM Terry DameJonathan M Dyke ZOX:[id number]    PATIENT STRESSORS: Financial difficulties Substance abuse   PATIENT STRENGTHS: Communication skills General fund of knowledge Motivation for treatment/growth Physical Health   PATIENT IDENTIFIED PROBLEMS: Depression  Suicidal ideation  Substance abuse  "I just want to feel better and not be so depressed"  "I want to be able to walk on a side walk and not think about jumping into traffic"             DISCHARGE CRITERIA:  Improved stabilization in mood, thinking, and/or behavior Verbal commitment to aftercare and medication compliance Withdrawal symptoms are absent or subacute and managed without 24-hour nursing intervention  PRELIMINARY DISCHARGE PLAN: Outpatient therapy Medication management  PATIENT/FAMILY INVOLVEMENT: This treatment plan has been presented to and reviewed with the patient, Terry Wells.  The patient and family have been given the opportunity to ask questions and make suggestions.  Levin BaconHeather V Apolonio Cutting, RN 04/03/2017, 8:43 PM

## 2017-04-03 NOTE — ED Notes (Signed)
Per Steffanie RainwaterLindsey, [facility]

## 2017-04-03 NOTE — H&P (Signed)
Behavioral Health Medical Screening Exam  Terry DameJonathan M Burggraf is an 47 y.o. male who presents as a walk in due to depression with active plan of self harm. States "There is nothing keeping me alive. I have poison all around my apartment. The only thing that has stopped me is not having courage to end it all." Patient [facility]. Denies past medical history of Hypertension but states "I think my father had it."   Total Time spent with patient: 20 minutes  Psychiatric Specialty Exam: Physical Exam  Constitutional: He is oriented to person, place, and time. He appears well-developed and well-nourished.  HENT:  Head: Normocephalic and atraumatic.  Eyes: Pupils are equal, round, and reactive to light.  Neck: Normal range of motion.  Cardiovascular: Normal rate, regular rhythm, normal heart sounds and intact distal pulses.  Respiratory: Effort normal and breath sounds normal.  GI: Soft. Bowel sounds are normal.  Musculoskeletal: Normal range of motion.  Neurological: He is alert and oriented to person, place, and time.  Skin: Skin is warm and dry.    Review of Systems  Neurological: Positive for headaches (For the last two weeks. ).  Psychiatric/Behavioral: Positive for depression and suicidal ideas. The patient is nervous/anxious.     Blood pressure (!) 171/112, pulse 94, temperature 98 F (36.7 C), resp. rate 18, SpO2 100 %.There is no height or weight on file to calculate BMI.  General Appearance: Casual  Eye Contact:  Good but wearing sunglasses "due to sensitive eyes."   Speech:  Clear and Coherent  Volume:  Normal  Mood:  Dysphoric  Affect:  Congruent  Thought Process:  Coherent and Goal Directed  Orientation:  Full (Time, Place, and Person)  Thought Content:  Depressive symptoms  Suicidal Thoughts:  Yes.  with intent/plan  Homicidal Thoughts:  No  Memory:  Immediate;   Fair Recent;    Good Remote;   Good  Judgement:  Fair  Insight:  Present  Psychomotor Activity:  Normal  Concentration: Concentration: Good and Attention Span: Good  Recall:  Good  Fund of Knowledge:Good  Language: Good  Akathisia:  No  Handed:  Right  AIMS (if indicated):     Assets:  Communication Skills Desire for Improvement Housing Leisure Time Physical Health Resilience Social Support  Sleep:       Musculoskeletal: Strength & Muscle Tone: within normal limits Gait & Station: normal Patient leans: N/A  Blood pressure (!) 171/112, pulse 94, temperature 98 F (36.7 C), resp. rate 18, SpO2 100 %.  Recommendations:  Based on my evaluation the patient appears to have an emergency medical condition for which I recommend the patient be transferred to the emergency department for further evaluation. Patient's blood pressure severely elevated with report of headache for the last two weeks.   Fransisca KaufmannAVIS, Inda Mcglothen, NP 04/03/2017, 10:57 AM

## 2017-04-03 NOTE — ED Triage Notes (Signed)
[facility] Patient reports that has SI with plan on poisoning himself.

## 2017-04-03 NOTE — BH Assessment (Addendum)
Assessment Note  Terry Wells is an 47 y.o. male presents to [facility] voluntarily and alone for SI. Pt reports he wants to end his life with with a plan to poison himself with cleaning  Supplies in his home. Pt reports he has no reason to live. Pt reports he know he will kill himself as soon as he can "work up the courage it's just a matter of time.  Pt denies homicidal thoughts or physical aggression. Pt denies having access to firearms. Pt denies having any legal problems at this time. Pt denies hallucinations. Pt does not appear to be responding to internal stimuli and exhibits no delusional thought. Pt's reality testing appears to be intact. Pt denies SA but reports "I probably do drink too much booze at times".   Pt states "life is his stressor'. Pt reports he has a brother that supports him. Pt reports he can't hold a job longer than a few weeks. Pt denies any legal issues att his time. Pt denies hx of inpatient hospitalization and outpatient services.   Pt is dressed in street clothes and sunglasses reporting "the light hurts my eyes". Pt is alert, oriented x4 with normal speech and normal motor behavior. Eye contact is fair and Pt is sad. Pt's mood is depressed and affect is anxious. Thought process is coherent and relevant. Pt's insight is poor and judgement is impaired. There is no indication Pt is currently responding to internal stimuli or experiencing delusional thought content. Pt was cooperative throughout assessment. He says he is willing to sign voluntarily into a psychiatric facility   Diagnosis: F32.2 Major depressive disorder, Single episode, Severe   Past Medical History: No past medical history on file.  Past Surgical History:  Procedure Laterality Date  . WRIST SURGERY      Family History: No family history on file.  Social History:  reports that  has never smoked. He does not have any smokeless tobacco history on file. He reports that he drinks alcohol. He reports that  he does not use drugs.  Additional Social History:  Alcohol / Drug Use Pain Medications: See MAR Prescriptions: See MAR Over the Counter: See MAR History of alcohol / drug use?: Yes Longest period of sobriety (when/how long): Ukn Substance #1 Name of Substance 1: Etoh 1 - Age of First Use: Ukn 1 - Amount (size/oz): pt reports he probably drinks more than he should. 1 - Frequency: Ukn 1 - Duration: On-going 1 - Last Use / Amount: 04/03/17  CIWA: CIWA-Ar BP: (!) 171/112 Pulse Rate: 94 COWS:    Allergies: No Known Allergies  Home Medications:  (Not in a hospital admission)  OB/GYN Status:  No LMP for male patient.  General Assessment Data Location of Assessment: [facility] Assessment Services TTS Assessment: In system Is this a Tele or Face-to-Face Assessment?: Face-to-Face Is this an Initial Assessment or a Re-assessment for this encounter?: Initial Assessment Marital status: Single Living Arrangements: Other relatives Can pt return to current living arrangement?: Yes Admission Status: Voluntary Is patient capable of signing voluntary admission?: Yes Referral Source: Self/Family/Friend Insurance type: None  Medical Screening Exam [facility] Walk-in ONLY) Medical Exam completed: Yes  Crisis Care Plan Living Arrangements: Other relatives Name of Psychiatrist: None Name of Therapist: None  Education Status Is patient currently in school?: No Highest grade of school patient has completed: [employer]  Risk to self with the past 6 months Suicidal Ideation: Yes-Currently Present Has patient been a risk to self within the past 6  months prior to admission? : Yes Suicidal Intent: Yes-Currently Present Has patient had any suicidal intent within the past 6 months prior to admission? : Yes Is patient at risk for suicide?: Yes Suicidal Plan?: Yes-Currently Present Has patient had any suicidal plan within the past 6 months prior to admission? : Yes Specify Current Suicidal Plan:  Poison himself with household chemicals Access to Means: Yes Specify Access to Suicidal Means: Household cleaning products What has been your use of drugs/alcohol within the last 12 months?: Etoh Previous Attempts/Gestures: No Intentional Self Injurious Behavior: None Family Suicide History: No Recent stressful life event(s): Other (Comment)(Pt reports life) Persecutory voices/beliefs?: No Depression: Yes Depression Symptoms: Despondent, Insomnia, Feeling worthless/self pity, Loss of interest in usual pleasures, Fatigue, Isolating Substance abuse history and/or treatment for substance abuse?: No Suicide prevention information given to non-admitted patients: Not applicable  Risk to Others within the past 6 months Homicidal Ideation: No Does patient have any lifetime risk of violence toward others beyond the six months prior to admission? : No Thoughts of Harm to Others: No Current Homicidal Intent: No Current Homicidal Plan: No Access to Homicidal Means: No History of harm to others?: No Assessment of Violence: None Noted Does patient have access to weapons?: No Criminal Charges Pending?: No Does patient have a court date: No Is patient on probation?: No  Psychosis Hallucinations: None noted Delusions: None noted  Mental Status Report Appearance/Hygiene: Unremarkable, Bizarre(Pt wore sunglasses stating the light hurt his eyes. ) Eye Contact: Unable to Assess Motor Activity: Freedom of movement Speech: Logical/coherent Level of Consciousness: Alert Mood: Depressed Affect: Anxious Anxiety Level: Minimal Judgement: Impaired Orientation: Person, Place, Time, Situation, Appropriate for developmental age Obsessive Compulsive Thoughts/Behaviors: None  Cognitive Functioning Concentration: Normal Memory: Recent Intact IQ: Average Insight: Poor Impulse Control: Poor Appetite: Fair Sleep: Decreased Total Hours of Sleep: (Very little) Vegetative Symptoms: None  ADLScreening  [facility] Assessment [facility]) Patient's cognitive ability adequate to safely complete daily activities?: Yes Patient able to express need for assistance with ADLs?: Yes Independently performs ADLs?: Yes (appropriate for developmental age)  Prior Inpatient Therapy Prior Inpatient Therapy: No  Prior Outpatient Therapy Prior Outpatient Therapy: No  ADL Screening (condition at time of admission) Patient's cognitive ability adequate to safely complete daily activities?: Yes Is the patient deaf or have difficulty hearing?: No Does the patient have difficulty seeing, even when wearing glasses/contacts?: No Does the patient have difficulty concentrating, remembering, or making decisions?: No Patient able to express need for assistance with ADLs?: Yes Does the patient have difficulty dressing or bathing?: No Independently performs ADLs?: Yes (appropriate for developmental age) Does the patient have difficulty walking or climbing stairs?: No Weakness of Legs: None Weakness of Arms/Hands: None       Abuse/Neglect Assessment (Assessment to be complete while patient is alone) Abuse/Neglect Assessment Can Be Completed: Yes Physical Abuse: Denies Verbal Abuse: Denies Sexual Abuse: Denies Exploitation of patient/patient's resources: Denies Self-Neglect: Denies Values / Beliefs Cultural Requests During Hospitalization: None Spiritual Requests During Hospitalization: None Consults Spiritual Care Consult Needed: No Social Work Consult Needed: No Merchant navy officerAdvance Directives (For Healthcare) Does Patient Have a Medical Advance Directive?: No Would patient like information on creating a medical advance directive?: No - Patient declined    Additional Information 1:1 In Past 12 Months?: No CIRT Risk: No Elopement Risk: No Does patient have medical clearance?: No(Sent to [facility] due to HBP and light sensitivity)     Disposition:  Disposition Initial Assessment Completed for this Encounter:  Yes Disposition of  Patient: Inpatient treatment program Type of inpatient treatment program: Adult   Per Fransisca Kaufmann, NP pt meets inpatient criteria. Pt sent to [facility] for med clearance.   On Site Evaluation by:   Reviewed with Physician:    Danae Orleans, MA, LPCA 04/03/2017 11:26 AM

## 2017-04-03 NOTE — ED Notes (Signed)
This nurse went to recheck patient bp, per PA request. Patient is standing in room when nurse walks in. Patient states "Im ready to leave. I cant do this anymore." This nurse explained to the patient that he is voluntary and can leave, but because he shared that he was a danger to himself, it would be best if he would wait and speak to a doctor. EDMD notified and EDPA notified. ED charge RN notified.

## 2017-04-03 NOTE — Discharge Instructions (Addendum)
You were noted to have high blood pressure today during your visit in the emergency department.  You were given a medication called lisinopril that you need to take on a daily basis to help bring down your blood pressure.  You need to follow up with your primary care doctor for further treatment of this. If you experience any chest pain, shortness of breath, headaches, vision changes, numbness, weakness, lightheadedness, changes in mental status, or decrease in urination you should return to the emergency department immediately.

## 2017-04-03 NOTE — ED Notes (Signed)
Spoke to SmyrnaDave with TTS, [facility] and get them to look over patient chart since BP has decreased. He will [facility] us know [facility].

## 2017-04-03 NOTE — ED Notes (Signed)
[employer] [place] [facility]

## 2017-04-03 NOTE — Progress Notes (Signed)
Patient ID: Terry DameJonathan M Wells, male   DOB: 10/28/1970, 47 y.o.   MRN: [id number]  Report accepted from admitting nurse, Herbert SetaHeather, RN. Pt currently presents with a flat affect and cooperative behavior. Pt sways slightly while standing. Pt states "I just want to sleep." Pt reports that he has never had seizures with withdrawal. Refused thiamine IM but took Ativan 1mg  tonight. Pt supported emotionally and encouraged to express concerns and questions. Pt's safety ensured with 15 minute and environmental checks. Pt currently denies SI/HI and A/V hallucinations. Pt verbally agrees to seek staff if SI/HI or A/VH occurs and to consult with staff before acting on any harmful thoughts. Will continue POC.

## 2017-04-03 NOTE — ED Provider Notes (Addendum)
[facility]-EMERGENCY DEPT Provider Note   CSN: [id number] Arrival date & time: 04/03/17  1119     History   Chief Complaint Chief Complaint  Patient presents with  . Suicidal  . IVC    HPI Terry Wells is a 48 y.o. male.  HPI   Patient is a 47 year old male who presents the ED today complaining of depression and suicidal ideations that he states he has had for over the last 2 years, however have become more frequent and persistent recently.  Patient states that he knows he is going to commit suicide eventually, it is just a matter of when.  He states the easiest way to do this would be to mix a bunch of chemicals together and take them because it would be quick and painless.  Denies any homicidal ideations.  Denies any hallucinations. Denies prior attempts of suicide.  States that he drinks about 12 beers every night and has done so since the 1990s.  His last drink was last night and he states he drank about 12 beers.  He denies any history of withdrawal seizures.  Denies any history of suicide attempts.  States he attempted to go to [facility] prior to arrival as well as [facility], and he was sent here for further evaluation and medical clearance.  States he has a history of chronic headaches every day for the last year. He has similar headache today. Headache is frontal, he denies any vision changes.  Denies that the headache today had sudden onset and denies that this is the worst headache of his life.  Rates it at 2-3/10, and describes it as a tension headache.   He does endorse some photophobia, but no vision changes, weakness, lightheadedness, numbness.  Denies any fevers, abd pain, nausea, vomiting, diarrhea, constipation, chest pain, shortness of breath, urinary symptoms, URI symptoms, or any other symptoms.  History reviewed. No pertinent past medical history.  Patient Active Problem List   Diagnosis Date Noted  . MDD (major depressive disorder) 04/03/2017     Past Surgical History:  Procedure Laterality Date  . WRIST SURGERY         Home Medications    Prior to Admission medications   Medication Sig Start Date End Date Taking? Authorizing Provider  loratadine-pseudoephedrine (CLARITIN-D 24-HOUR) 10-240 MG 24 hr tablet Take 1 tablet by mouth daily.   Yes [provider]  amoxicillin (AMOXIL) 500 MG capsule Take 1 capsule (500 mg total) by mouth 3 (three) times daily. Patient not taking: Reported on 04/03/2017 01/18/15   Pisciotta, Joni Reining, PA-C  azithromycin (ZITHROMAX) 250 MG tablet Take 1 tablet (250 mg total) by mouth daily. Take first 2 tablets together, then 1 every day until finished. Patient not taking: Reported on 04/03/2017 05/15/15   Vanetta Mulders, MD  benzonatate (TESSALON) 100 MG capsule Take 1 capsule (100 mg total) by mouth every 8 (eight) hours. Patient not taking: Reported on 04/03/2017 05/11/15   Emi Holes, PA-C  HYDROcodone-acetaminophen (NORCO/VICODIN) 5-325 MG per tablet Take 1-2 tablets by mouth every 4 (four) hours as needed for moderate pain or severe pain. Patient not taking: Reported on 04/03/2017 02/19/14   Emilia Beck, PA-C  HYDROcodone-acetaminophen (NORCO/VICODIN) 5-325 MG tablet Take 1-2 tablets by mouth every 6 (six) hours as needed for moderate pain. Patient not taking: Reported on 04/03/2017 05/15/15   Vanetta Mulders, MD  lisinopril (PRINIVIL,ZESTRIL) 10 MG tablet Take 1 tablet (10 mg total) by mouth daily. 04/03/17   Sims Laday S,  PA-C  naproxen (NAPROSYN) 500 MG tablet Take 1 tablet (500 mg total) by mouth 2 (two) times daily. Patient not taking: Reported on 04/03/2017 05/15/15   Vanetta Mulders, MD  predniSONE (DELTASONE) 20 MG tablet Take 2 tablets (40 mg total) by mouth daily. Patient not taking: Reported on 04/03/2017 01/18/15   Pisciotta, Joni Reining, PA-C    Family History No family history on file.  Social History Social History   Tobacco Use  . Smoking status: Never Smoker  .  Smokeless tobacco: Never Used  Substance Use Topics  . Alcohol use: Yes  . Drug use: No     Allergies   Patient has no known allergies.   Review of Systems Review of Systems   Physical Exam Updated Vital Signs BP (!) 144/98   Pulse 91   Temp 97.8 F (36.6 C) (Oral)   Resp 18   SpO2 97%   Physical Exam  Constitutional: He is oriented to person, place, and time. He appears well-developed and well-nourished. No distress.  HENT:  Head: Normocephalic and atraumatic.  Eyes: Conjunctivae and EOM are normal. Pupils are equal, round, and reactive to light.  Neck: Normal range of motion. Neck supple.  No nuchal rigidity  Cardiovascular: Normal rate, regular rhythm and intact distal pulses.  No murmur heard. Pulmonary/Chest: Effort normal and breath sounds normal. No respiratory distress. He has no wheezes.  Abdominal: Soft. Bowel sounds are normal. There is no tenderness.  Musculoskeletal: He exhibits no edema.  Neurological: He is alert and oriented to person, place, and time.  Mental Status:  Alert, thought content appropriate, able to give a coherent history. Speech fluent without evidence of aphasia. Able to follow 2 step commands without difficulty.  Cranial Nerves:  II:  Peripheral visual fields grossly normal, pupils equal, round, reactive to light III,IV, VI: ptosis not present, extra-ocular motions intact bilaterally  V,VII: smile symmetric, facial light touch sensation equal VIII: hearing grossly normal to voice  X: uvula elevates symmetrically  XI: bilateral shoulder shrug symmetric and strong XII: midline tongue extension without fassiculations Motor:  Normal tone. 5/5 strength of BUE and BLE major muscle groups including strong and equal grip strength and dorsiflexion/plantar flexion Sensory: light touch normal in all extremities. Cerebellar: normal finger-to-nose with bilateral upper extremities Gait: normal gait and balance.  CV: 2+ radial and DP/PT pulses    Skin: Skin is warm and dry. Capillary refill takes less than 2 seconds.  Psychiatric: He has a normal mood and affect.  Nursing note and vitals reviewed.    ED Treatments / Results  Labs (all labs ordered are listed, but only abnormal results are displayed) Labs Reviewed  COMPREHENSIVE METABOLIC PANEL - Abnormal; Notable for the following components:      Result Value   CO2 20 (*)    Total Bilirubin 1.9 (*)    All other components within normal limits  ACETAMINOPHEN LEVEL - Abnormal; Notable for the following components:   Acetaminophen (Tylenol), Serum <10 (*)    All other components within normal limits  URINALYSIS, ROUTINE W REFLEX MICROSCOPIC - Abnormal; Notable for the following components:   Hgb urine dipstick SMALL (*)    All other components within normal limits  ETHANOL  SALICYLATE LEVEL  CBC  RAPID URINE DRUG SCREEN, HOSP PERFORMED    EKG  EKG Interpretation None       Radiology No results found.  Procedures Procedures (including critical care time)  Medications Ordered in ED Medications  LORazepam (ATIVAN) tablet 2 mg (  2 mg Oral Given 04/03/17 1517)  lisinopril (PRINIVIL,ZESTRIL) tablet 10 mg (10 mg Oral Given 04/03/17 1614)     Initial Impression / Assessment and Plan / ED Course  I have reviewed the triage vital signs and the nursing notes.  Pertinent labs & imaging results that were available during my care of the patient were reviewed by me and considered in my medical decision making (see chart for details).  Discussed pt presentation and exam findings with Dr. Ranae Palms, who reviewed the pts workup and vital signs. He advised me to give ativan and recheck BP.  Discussed pt case and presentation with Dr. Erma Heritage who recommends that if pts BP does not decrease with 10mg  lisinopril then to also give hctz 25mg . Advised to send pt home with Rx for BP med.  Final Clinical Impressions(s) / ED Diagnoses   Final diagnoses:  Medical clearance for  psychiatric admission  History of ETOH abuse  Hypertension, unspecified type   47 year old male presenting to the ED today complaining of depression and SI that has been going on on for about 2 years, but worsened recently.  Is endorsing SI with plan today.  No HI or hallucinations.  Also with chronic EtOH abuse for years.  Last drink was last night.  Denies any history of withdrawal seizures.  Will place on CIWA protocol and consult TTS for further evaluation.    Laboratory work is grossly normal, with mild elevation in total bili to 1.9.  However appears to be improved from patient's visit 1 year ago.  Patient safe to follow-up as an outpatient to have this rechecked.  Remainder of lab work non-concerning.  Patient cleared to be transferred to behavioral health however he will need to remain on CIWA protocol while he is there. CIWA here was 3.   Mild headache as well as some anxiety so treated with 2mg  ativan.  He is in no acute distress here, and has no tremors on exam.  He is denying any hallucinations.  No diaphoresis.  No nausea vomiting.  No tactile, auditory, or visual disturbances. No clouding of sensorium, patient is oriented x3.  Safe for transfer to [facility] with CIWA protocol in place. Pt does not appear to be in florid withdrawal and has stable here and in no acute distress.  Informed by nursing that [facility] will not take pt with current BP at 158/109. Pt given 10mg  lisinopril and BP rechecked was 141/98. Doubt hypertensive urgency as pt does not have numbness, weakness, chest pain, dyspnea, dizziness, or lightheadedness.  Pt did have headache during eval, however he states headache is chronic for him and unchanged from previous HA's he had over the last year. No vision changes and normal neuro exam.  Discussed elevated blood pressure with the patient and the need for primary care follow up with potential need to initiate or change. Will give Rx for 10mg  lisinopril daily and have pt f/u as outpatient  for further tx of this. Discussed return precaution signs/symptoms for hypertensive emergency as listed above with the patient.   Contacted Mardella Layman, who is AC at [facility]. She confirms that pt will be placed on CIWA protocol while at [facility].  Pts final pulse rate was not communicated to myself prior to discharge. On my final recheck his pulse was 94 on monitor and BP was 144/98.   ED Discharge Orders        Ordered    Consult to Peer Support    Provider:  (Not yet assigned)  04/03/17 1431    lisinopril (PRINIVIL,ZESTRIL) 10 MG tablet  Daily     04/03/17 1844       Karrie MeresCouture, Ezri Fanguy S, PA-C 04/04/17 0103    Jamire Shabazz S, PA-C 04/04/17 0106    Karrie MeresCouture, Jacqueline Delapena S, PA-C 04/04/17 [phone]    Loren RacerYelverton, David, MD 04/04/17 [phone]

## 2017-04-03 NOTE — ED Notes (Signed)
Patient states that he needs to leave by 4p. Informed him that they (EDP) has filled out IVC papers on him and so he is not able to leave voluntarily at this point.  We are trying to get BP down and waiting on HTN medication to come up from pharmacy.  [facility], due to BP being elevated and once we get it down we will get him transferred across the street.

## 2017-04-03 NOTE — Progress Notes (Signed)
Terry Wells is a 47 year old male being admitted involuntarily to [facility].  He was sent to [facility] for medical clearance due to his blood pressure being high.  He came in with suicidal ideation with plan to drink poison.   He reported to EMS that he was suicidal with a plan to shoot self.  He admitted to using Xanax and drinking beer.  [facility], he denies suicidal ideation.  He will contract for safety on the unit.  He denies HI or A/V hallucinations.  He reports feeling depressed his whole life and has never really felt normal.  He quit his job because he wasn't happy and just wants to sit around and drink.  He admits to drinking 10-12 12oz beers daily.  He denies any medical problems.  Oriented him to the unit.  Admission paperwork completed and signed.  Belongings searched and secured in locker # 10, no contraband found.  Skin assessment completed and rash in groin area noted.  Q 15 minute checks initiated for safety.  We will continue to monitor the progress towards his goals.

## 2017-04-04 DIAGNOSIS — F322 Major depressive disorder, single episode, severe without psychotic features: Principal | ICD-10-CM

## 2017-04-04 DIAGNOSIS — G4700 Insomnia, unspecified: Secondary | ICD-10-CM

## 2017-04-04 DIAGNOSIS — F1020 Alcohol dependence, uncomplicated: Secondary | ICD-10-CM | POA: Diagnosis present

## 2017-04-04 DIAGNOSIS — F419 Anxiety disorder, unspecified: Secondary | ICD-10-CM

## 2017-04-04 MED ORDER — GABAPENTIN 100 MG PO CAPS
100.0000 mg | ORAL_CAPSULE | Freq: Three times a day (TID) | ORAL | Status: DC
  Administered 2017-04-04 – 2017-04-06 (×7): 100 mg via ORAL
  Filled 2017-04-04: qty 21
  Filled 2017-04-04 (×3): qty 1
  Filled 2017-04-04 (×2): qty 21
  Filled 2017-04-04: qty 1
  Filled 2017-04-04: qty 21
  Filled 2017-04-04 (×3): qty 1
  Filled 2017-04-04: qty 21
  Filled 2017-04-04 (×3): qty 1
  Filled 2017-04-04: qty 21

## 2017-04-04 MED ORDER — CITALOPRAM HYDROBROMIDE 10 MG PO TABS
10.0000 mg | ORAL_TABLET | Freq: Every day | ORAL | Status: DC
Start: 2017-04-04 — End: 2017-04-06
  Administered 2017-04-04 – 2017-04-06 (×3): 10 mg via ORAL
  Filled 2017-04-04: qty 1
  Filled 2017-04-04: qty 7
  Filled 2017-04-04 (×4): qty 1
  Filled 2017-04-04: qty 7

## 2017-04-04 MED ORDER — CHLORDIAZEPOXIDE HCL 25 MG PO CAPS
25.0000 mg | ORAL_CAPSULE | Freq: Four times a day (QID) | ORAL | Status: DC | PRN
  Filled 2017-04-04: qty 1

## 2017-04-04 NOTE — Progress Notes (Addendum)
Patient ID: Terry DameJonathan M Gafford, male   DOB: 08/24/1970, 47 y.o.   MRN: [id number]  Pt currently presents with a flat affect and guarded, anxious behavior. Pt reports to writer that their goal is to "get help getting sober when I leave since I will be moving soon." Pt reports he is not sure where he is moving yet. Pt states tearfully, "It has actually really helped me just being here, being around other people and hearing what other people are dealing with. I normally live in a black hole." Pt requests sleep aid tonight but reports PTA he was sleeping well.   Pt provided with medications per providers orders. Pt's labs and vitals were monitored throughout the night. Pt given a 1:1 about emotional and mental status. Pt supported and encouraged to express concerns and questions. Pt educated on medications. Pt informed of general [facility] resources for sobriety, encouraged to speak with CSW for details. Pt unsure what his sobriety would "look like."   Pt's safety ensured with 15 minute and environmental checks. Pt currently denies SI/HI and A/V hallucinations. Pt verbally agrees to seek staff if SI/HI or A/VH occurs and to consult with staff before acting on any harmful thoughts. Pt continues to deny any physical complaints, visual assessment reveals Presents with signs and symptoms of withdrawal including fatigue, malaise and tremors.  Will continue POC.

## 2017-04-04 NOTE — Progress Notes (Signed)
[facility] Group Notes:  (Nursing/MHT/Case Management/Adjunct)  Date:  04/04/2017  Time:  4:35 PM  Type of Therapy:  Nurse Education  Participation Level:  Active  Participation Quality:  Appropriate  Affect:  Appropriate  Cognitive:  Alert  Insight:  Appropriate  Engagement in Group:  Engaged  Modes of Intervention:  Activity, Education and Support  Summary of Progress/Problems:The purpose of this group is to support patient's in mindfulness and relaxation.  Beatrix ShipperWright, Vedanth Sirico Martin 04/04/2017, 4:35 PM

## 2017-04-04 NOTE — Plan of Care (Signed)
Pt has been engaged in activities and interacting on the unit.

## 2017-04-04 NOTE — H&P (Signed)
Psychiatric Admission Assessment Adult  Patient Identification: Terry Wells MRN:  [id number] Date of Evaluation:  04/04/2017 Chief Complaint:  MDD  ALCOHOL ABUSE Principal Diagnosis: MDD (major depressive disorder), severe ([place]) Diagnosis:   Patient Active Problem List   Diagnosis Date Noted  . Alcohol dependence ([place]) [F10.20] 04/04/2017  . MDD (major depressive disorder), severe ([place]) [F32.2] 04/03/2017   History of Present Illness:  04/03/17 [facility] Counselor Assessment: 47 y.o. male presents to [facility] voluntarily and alone for SI. Pt reports he wants to end his life with with a plan to poison himself with cleaning  Supplies in his home. Pt reports he has no reason to live. Pt reports he know he will kill himself as soon as he can "work up the courage it's just a matter of time.  Pt denies homicidal thoughts or physical aggression. Pt denies having access to firearms. Pt denies having any legal problems at this time. Pt denies hallucinations. Pt does not appear to be responding to internal stimuli and exhibits no delusional thought. Pt's reality testing appears to be intact. Pt denies SA but reports "I probably do drink too much booze at times".  Pt states "life is his stressor'. Pt reports he has a brother that supports him. Pt reports he can't hold a job longer than a few weeks. Pt denies any legal issues att his time. Pt denies hx of inpatient hospitalization and outpatient services.  Pt is dressed in street clothes and sunglasses reporting "the light hurts my eyes". Pt is alert, oriented x4 with normal speech and normal motor behavior. Eye contact is fair and Pt is sad. Pt's mood is depressed and affect is anxious. Thought process is coherent and relevant. Pt's insight is poor and judgement is impaired. There is no indication Pt is currently responding to internal stimuli or experiencing delusional thought content. Pt was cooperative throughout assessment. He says he is willing to sign  voluntarily into a psychiatric facility  On Evaluation: Patient seen today and confirms the above except for reporting that he drinks 9-10 beers a day. He reports that he has stopped drinking in the past and his withdrawal symptoms are always mild. He denies any current withdrawal symptoms. He reports chronic depression, but has never been through any treatment and has never been on any psychiatric medications. He reports going to AA 2 different times in 2008 when he lived in [place]. He lives in an apartment alone. He has 2 children taht he has a good relationship with, they are 59 and 46 years old. He denies any family history, except for alcohol abuse. He is currently unemployed.     Associated Signs/Symptoms: Depression Symptoms:  depressed mood, insomnia, fatigue, feelings of worthlessness/guilt, hopelessness, suicidal thoughts with specific plan, suicidal attempt, anxiety, loss of energy/fatigue, (Hypo) Manic Symptoms:  Irritable Mood, Anxiety Symptoms:  Excessive Worry, Psychotic Symptoms:  Denies PTSD Symptoms: NA Total Time spent with patient: 45 minutes  Past Psychiatric History: Denies psychiatric history, long history of alcoholism  Is the patient at risk to self? No.  Has the patient been a risk to self in the past 6 months? No.  Has the patient been a risk to self within the distant past? No.  Is the patient a risk to others? No.  Has the patient been a risk to others in the past 6 months? No.  Has the patient been a risk to others within the distant past? No.   Prior Inpatient Therapy:   Prior Outpatient Therapy:  Alcohol Screening: 1. How often do you have a drink containing alcohol?: 4 or more times a week 2. How many drinks containing alcohol do you have on a typical day when you are drinking?: 10 or more 3. How often do you have six or more drinks on one occasion?: Daily or almost daily AUDIT-C Score: 12 4. How often during the last year have you found  that you were not able to stop drinking once you had started?: Less than monthly 5. How often during the last year have you failed to do what was normally expected from you becasue of drinking?: Never 6. How often during the last year have you needed a first drink in the morning to get yourself going after a heavy drinking session?: Never 7. How often during the last year have you had a feeling of guilt of remorse after drinking?: Never 8. How often during the last year have you been unable to remember what happened the night before because you had been drinking?: Never 9. Have you or someone else been injured as a result of your drinking?: Yes, during the last year 10. Has a relative or friend or a doctor or another health worker been concerned about your drinking or suggested you cut down?: Yes, but not in the last year Alcohol Use Disorder Identification Test Final Score (AUDIT): 19 Intervention/Follow-up: Alcohol Education Substance Abuse History in the last 12 months:  Yes.   Consequences of Substance Abuse: Medical Consequences:  reviewed Legal Consequences:  reviewed Family Consequences:  reviewed Withdrawal Symptoms:   None Previous Psychotropic Medications: No  Psychological Evaluations: No  Past Medical History: History reviewed. No pertinent past medical history.  Past Surgical History:  Procedure Laterality Date  . WRIST SURGERY     Family History: History reviewed. No pertinent family history. Family Psychiatric  History: Significant family history of alcohol abuse. Tobacco Screening: Have you used any form of tobacco in the last 30 days? (Cigarettes, Smokeless Tobacco, Cigars, and/or Pipes): No Social History:  Social History   Substance and Sexual Activity  Alcohol Use Yes     Social History   Substance and Sexual Activity  Drug Use No    Additional Social History: Marital status: Widowed Divorced, when?: Since 2012 Widowed, when?: Patient reports his ex-wife  passed away from an anuerism 4 months after their divorce.  What types of issues is patient dealing with in the relationship?: "It was not working out"  Additional relationship information: N/A  What is your sexual orientation?: Heterosexual;Straight Has your sexual activity been affected by drugs, alcohol, medication, or emotional stress?: No  Does patient have children?: Yes How many children?: 2 How is patient's relationship with their children?: Patient reports having a 15yo daughter and a 60yo son; Reports having a distant relationship and commnicates with them through [place].                          Allergies:  No Known Allergies Lab Results:  Results for orders placed or performed during the hospital encounter of 04/03/17 (from the past 48 hour(s))  Rapid urine drug screen (hospital performed)     Status: None   Collection Time: 04/03/17 11:32 AM  Result Value Ref Range   Opiates NONE DETECTED NONE DETECTED   Cocaine NONE DETECTED NONE DETECTED   Benzodiazepines NONE DETECTED NONE DETECTED   Amphetamines NONE DETECTED NONE DETECTED   Tetrahydrocannabinol NONE DETECTED NONE DETECTED   Barbiturates NONE DETECTED  NONE DETECTED    Comment: (NOTE) DRUG SCREEN FOR MEDICAL PURPOSES ONLY.  IF CONFIRMATION IS NEEDED FOR ANY PURPOSE, NOTIFY LAB WITHIN 5 DAYS. LOWEST DETECTABLE LIMITS FOR URINE DRUG SCREEN Drug Class                     Cutoff (ng/mL) Amphetamine and metabolites    1000 Barbiturate and metabolites    200 Benzodiazepine                 096 Tricyclics and metabolites     300 Opiates and metabolites        300 Cocaine and metabolites        300 THC                            50 Performed at [facility], [place] [address] [place], [place] [address]   Urinalysis, Routine w reflex microscopic     Status: Abnormal   Collection Time: 04/03/17 11:32 AM  Result Value Ref Range   Color, Urine YELLOW YELLOW   APPearance CLEAR CLEAR    Specific Gravity, Urine 1.009 1.005 - 1.030   pH 6.0 5.0 - 8.0   Glucose, UA NEGATIVE NEGATIVE mg/dL   Hgb urine dipstick SMALL (A) NEGATIVE   Bilirubin Urine NEGATIVE NEGATIVE   Ketones, ur NEGATIVE NEGATIVE mg/dL   Protein, ur NEGATIVE NEGATIVE mg/dL   Nitrite NEGATIVE NEGATIVE   Leukocytes, UA NEGATIVE NEGATIVE   RBC / HPF 0-5 0 - 5 RBC/hpf   WBC, UA 0-5 0 - 5 WBC/hpf   Bacteria, UA NONE SEEN NONE SEEN   Squamous Epithelial / LPF NONE SEEN NONE SEEN   Mucus PRESENT     Comment: Performed at [facility], [place] [address], [place], [place] [address]  Comprehensive metabolic panel     Status: Abnormal   Collection Time: 04/03/17 12:37 PM  Result Value Ref Range   Sodium 136 135 - 145 mmol/L   Potassium 4.1 3.5 - 5.1 mmol/L   Chloride 106 101 - 111 mmol/L   CO2 20 (L) 22 - 32 mmol/L   Glucose, Bld 94 65 - 99 mg/dL   BUN 11 6 - 20 mg/dL   Creatinine, Ser 0.94 0.61 - 1.24 mg/dL   Calcium 9.1 8.9 - 10.3 mg/dL   Total Protein 7.4 6.5 - 8.1 g/dL   Albumin 4.1 3.5 - 5.0 g/dL   AST 26 15 - 41 U/L   ALT 40 17 - 63 U/L   Alkaline Phosphatase 56 38 - 126 U/L   Total Bilirubin 1.9 (H) 0.3 - 1.2 mg/dL   GFR calc non Af Amer >60 >60 mL/min   GFR calc Af Amer >60 >60 mL/min    Comment: (NOTE) The eGFR has been calculated using the CKD EPI equation. This calculation has not been validated in all clinical situations. eGFR's persistently <60 mL/min signify possible Chronic Kidney Disease.    Anion gap 10 5 - 15    Comment: Performed at [facility], [place] [address] [place], [place] [address]  cbc     Status: None   Collection Time: 04/03/17 12:37 PM  Result Value Ref Range   WBC 4.6 4.0 - 10.5 K/uL   RBC 5.21 4.22 - 5.81 MIL/uL   Hemoglobin 16.8 13.0 - 17.0 g/dL   HCT 47.6 39.0 - 52.0 %   MCV 91.4 78.0 - 100.0 fL  MCH 32.2 26.0 - 34.0 pg   MCHC 35.3 30.0 - 36.0 g/dL   RDW 12.0 11.5 - 15.5 %   Platelets 175 150 - 400 K/uL    Comment: Performed  at [facility], [place] [address] [place], [place] [address]  Ethanol     Status: None   Collection Time: 04/03/17 12:38 PM  Result Value Ref Range   Alcohol, Ethyl (B) <10 <10 mg/dL    Comment:        LOWEST DETECTABLE LIMIT FOR SERUM ALCOHOL IS 10 mg/dL FOR MEDICAL PURPOSES ONLY Performed at [facility], [place] [address] [place], [place] [address]   Salicylate level     Status: None   Collection Time: 04/03/17 12:38 PM  Result Value Ref Range   Salicylate Lvl <6.7 2.8 - 30.0 mg/dL    Comment: Performed at [facility], [place] [address] [place], [place] [address]  Acetaminophen level     Status: Abnormal   Collection Time: 04/03/17 12:38 PM  Result Value Ref Range   Acetaminophen (Tylenol), Serum <10 (L) 10 - 30 ug/mL    Comment:        THERAPEUTIC CONCENTRATIONS VARY SIGNIFICANTLY. A RANGE OF 10-30 ug/mL MAY BE AN EFFECTIVE CONCENTRATION FOR MANY PATIENTS. HOWEVER, SOME ARE BEST TREATED AT CONCENTRATIONS OUTSIDE THIS RANGE. ACETAMINOPHEN CONCENTRATIONS >150 ug/mL AT 4 HOURS AFTER INGESTION AND >50 ug/mL AT 12 HOURS AFTER INGESTION ARE OFTEN ASSOCIATED WITH TOXIC REACTIONS. Performed at [facility], [place] [address], [place], [place] [address]     Blood Alcohol level:  Lab Results  Component Value Date   ETH <10 12/45/8099    Metabolic Disorder Labs:  No results found for: HGBA1C, MPG No results found for: PROLACTIN No results found for: CHOL, TRIG, HDL, CHOLHDL, VLDL, LDLCALC  Current Medications: Current Facility-Administered Medications  Medication Dose Route Frequency Provider Last Rate Last Dose  . acetaminophen (TYLENOL) tablet 650 mg  650 mg Oral Q6H PRN Niel Hummer, NP      . alum & mag hydroxide-simeth (MAALOX/MYLANTA) 200-200-20 MG/5ML suspension 30 mL  30 mL Oral Q4H PRN Elmarie Shiley A, NP      . chlordiazePOXIDE (LIBRIUM) capsule 25 mg  25 mg Oral QID PRN [place] Savage, Lowry Ram, FNP       . citalopram (CELEXA) tablet 10 mg  10 mg Oral Daily Giordano Getman, Lowry Ram, FNP   10 mg at 04/04/17 1126  . gabapentin (NEURONTIN) capsule 100 mg  100 mg Oral TID Dolce Sylvia, Lowry Ram, FNP   100 mg at 04/04/17 1126  . hydrOXYzine (ATARAX/VISTARIL) tablet 25 mg  25 mg Oral Q6H PRN Elmarie Shiley A, NP      . loperamide (IMODIUM) capsule 2-4 mg  2-4 mg Oral PRN Elmarie Shiley A, NP      . magnesium hydroxide (MILK OF MAGNESIA) suspension 30 mL  30 mL Oral Daily PRN Niel Hummer, NP      . multivitamin with minerals tablet 1 tablet  1 tablet Oral Daily Niel Hummer, NP   1 tablet at 04/04/17 0829  . ondansetron (ZOFRAN-ODT) disintegrating tablet 4 mg  4 mg Oral Q6H PRN Elmarie Shiley A, NP      . thiamine (B-1) injection 100 mg  100 mg Intramuscular Once Elmarie Shiley A, NP      . thiamine (VITAMIN B-1) tablet 100 mg  100 mg Oral Daily Elmarie Shiley A, NP   100 mg at 04/04/17 0829  .  traZODone (DESYREL) tablet 50 mg  50 mg Oral QHS PRN Niel Hummer, NP       PTA Medications: Medications Prior to Admission  Medication Sig Dispense Refill Last Dose  . amoxicillin (AMOXIL) 500 MG capsule Take 1 capsule (500 mg total) by mouth 3 (three) times daily. (Patient not taking: Reported on 04/03/2017) 30 capsule 0 Not Taking at Unknown time  . azithromycin (ZITHROMAX) 250 MG tablet Take 1 tablet (250 mg total) by mouth daily. Take first 2 tablets together, then 1 every day until finished. (Patient not taking: Reported on 04/03/2017) 6 tablet 0 Not Taking at Unknown time  . benzonatate (TESSALON) 100 MG capsule Take 1 capsule (100 mg total) by mouth every 8 (eight) hours. (Patient not taking: Reported on 04/03/2017) 21 capsule 0 Not Taking at Unknown time  . HYDROcodone-acetaminophen (NORCO/VICODIN) 5-325 MG per tablet Take 1-2 tablets by mouth every 4 (four) hours as needed for moderate pain or severe pain. (Patient not taking: Reported on 04/03/2017) 15 tablet 0 Not Taking at Unknown time  . HYDROcodone-acetaminophen  (NORCO/VICODIN) 5-325 MG tablet Take 1-2 tablets by mouth every 6 (six) hours as needed for moderate pain. (Patient not taking: Reported on 04/03/2017) 10 tablet 0 Not Taking at Unknown time  . lisinopril (PRINIVIL,ZESTRIL) 10 MG tablet Take 1 tablet (10 mg total) by mouth daily. 30 tablet 0   . loratadine-pseudoephedrine (CLARITIN-D 24-HOUR) 10-240 MG 24 hr tablet Take 1 tablet by mouth daily.   Past Month at Unknown time  . naproxen (NAPROSYN) 500 MG tablet Take 1 tablet (500 mg total) by mouth 2 (two) times daily. (Patient not taking: Reported on 04/03/2017) 14 tablet 0 Not Taking at Unknown time  . predniSONE (DELTASONE) 20 MG tablet Take 2 tablets (40 mg total) by mouth daily. (Patient not taking: Reported on 04/03/2017) 10 tablet 0 Not Taking at Unknown time    Musculoskeletal: Strength & Muscle Tone: within normal limits Gait & Station: normal Patient leans: N/A  Psychiatric Specialty Exam: Physical Exam  Nursing note and vitals reviewed. Constitutional: He is oriented to person, place, and time. He appears well-developed and well-nourished.  Respiratory: Effort normal.  Musculoskeletal: Normal range of motion.  Neurological: He is alert and oriented to person, place, and time.  Skin: Skin is warm.    Review of Systems  Constitutional: Negative.   HENT: Negative.   Eyes: Negative.   Respiratory: Negative.   Cardiovascular: Negative.   Gastrointestinal: Negative.   Genitourinary: Negative.   Musculoskeletal: Negative.   Skin: Negative.   Neurological: Negative.   Endo/Heme/Allergies: Negative.   Psychiatric/Behavioral: Positive for depression and substance abuse. Negative for hallucinations and suicidal ideas. The patient is nervous/anxious.     Blood pressure (!) 124/92, pulse (!) 111, temperature 97.6 F (36.4 C), temperature source Oral, resp. rate 18, height 6' 1.75" (1.873 m), weight 117.9 kg (260 lb).Body mass index is 33.61 kg/m.  General Appearance: Casual  Eye  Contact:  Good  Speech:  Clear and Coherent and Normal Rate  Volume:  Normal  Mood:  Euthymic  Affect:  Congruent  Thought Process:  Goal Directed and Descriptions of Associations: Intact  Orientation:  Full (Time, Place, and Person)  Thought Content:  WDL  Suicidal Thoughts:  No  Homicidal Thoughts:  No  Memory:  Immediate;   Good Recent;   Good Remote;   Good  Judgement:  Fair  Insight:  Good  Psychomotor Activity:  Normal  Concentration:  Concentration: Good and Attention Span: Good  Recall:  Good  Fund of Knowledge:  Good  Language:  Good  Akathisia:  No  Handed:  Right  AIMS (if indicated):     Assets:  Communication Skills Desire for Improvement Financial Resources/Insurance Housing Physical Health Social Support Transportation  ADL's:  Intact  Cognition:  WNL  Sleep:  Number of Hours: 6.75    Treatment Plan Summary: Daily contact with patient to assess and evaluate symptoms and progress in treatment, Medication management and Plan is to:  -Encourage group therapy participation -See SRA and MAR for medication management  Observation Level/Precautions:  15 minute checks  Laboratory:  Reviewed  Psychotherapy:  Group therapy  Medications:  See [facility]  Consultations:  As needed  Discharge Concerns:  Relapse  Estimated LOS: 3-5 Days  Other:  Admit to [place] for Primary Diagnosis: MDD (major depressive disorder), severe ([place]) Long Term Goal(s): Improvement in symptoms so as ready for discharge  Short Term Goals: Ability to disclose and discuss suicidal ideas, Compliance with prescribed medications will improve and Ability to identify triggers associated with substance abuse/mental health issues will improve  Physician Treatment Plan for Secondary Diagnosis: Principal Problem:   MDD (major depressive disorder), severe ([place]) Active Problems:   Alcohol dependence ([place])  Long Term Goal(s): Improvement in symptoms so as ready for  discharge  Short Term Goals: Ability to verbalize feelings will improve, Ability to disclose and discuss suicidal ideas and Ability to demonstrate self-control will improve  I certify that inpatient services furnished can reasonably be expected to improve the patient's condition.    Lewis Shock, FNP 2/26/20191:26 PM

## 2017-04-04 NOTE — Progress Notes (Signed)
D:Pt denies any withdrawal symptoms. He is interacting out in the dayroom with his peers on the unit. Pt rates depression as a 1 and anxiey as a 0 on 0-10 scale with 10 being the most. Pt said that he moved from [place] in 2012 to [place] and has a brother that is a Programmer, multimediapreacher in [place]. A:Offered support, encouragement and 15 minute checks.  R:Pt denies si and hi. Safety maintained on the unit.

## 2017-04-04 NOTE — [facility] Suicide Risk Assessment (Signed)
[facility] INPATIENT:  Family/Significant Other Suicide Prevention Education  Suicide Prevention Education:  Education Completed; Terry Wells (brother, [phone])  has been identified by the patient as the family member/significant other with whom the patient will be residing, and identified as the person(s) who will aid the patient in the event of a mental health crisis (suicidal ideations/suicide attempt).  With written consent from the patient, the family member/significant other has been provided the following suicide prevention education, prior to the and/or following the discharge of the patient.  The suicide prevention education provided includes the following:  Suicide risk factors  Suicide prevention and interventions  National Suicide Hotline telephone number  [facility] assessment telephone number  [facility] Assistance 911  [facility] and/or Residential Mobile Crisis Unit telephone number  Request made of family/significant other to:  Remove weapons (e.g., guns, rifles, knives), all items previously/currently identified as safety concern.    Remove drugs/medications (over-the-counter, prescriptions, illicit drugs), all items previously/currently identified as a safety concern.  The family member/significant other verbalizes understanding of the suicide prevention education information provided.  The family member/significant other agrees to remove the items of safety concern listed above.  Terry Wells 04/04/2017, 3:18 PM
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
## 2017-04-04 NOTE — [facility] Suicide Risk Assessment (Signed)
[facility] Admission Suicide Risk Assessment   Nursing information obtained from:  Patient Demographic factors:  Male Current Mental Status:  Self-harm thoughts Loss Factors:  Financial problems / change in socioeconomic status Historical Factors:  Family history of mental illness or substance abuse Risk Reduction Factors:  Responsible for children under 46 years of age  Total Time spent with patient: 46 minutes Principal Problem: MDD (major depressive disorder), severe (HCC) Diagnosis:   Patient Active Problem List   Diagnosis Date Noted  . Alcohol dependence (HCC) [F10.20] 04/04/2017  . MDD (major depressive disorder), severe (HCC) [F32.2] 04/03/2017   Subjective Data:   47 y.o Caucasian male, divorced, unemployed, recently lost his housing. Background history of SUD. Presented to the unit with thoughts of suicide. Had plans to drink cleaning supplies. He has not kept a steady job lately. Says he has been struggling. He is originally from [place]. Says he hopes to live with his brother upon discharge. Routine labs are essentially normal. Toxicology is negative. UDS is negative. No alcohol. No past suicidal behavior, no family history of suicide, no evidence of psychosis. No evidence of mania. He is not in withdrawals. No cognitive impairment. No access to weapons. He is cooperative with care. He has agreed to treatment recommendations. He has agreed to communicate suicidal thoughts to staff if the thoughts becomes overwhelming.     Continued Clinical Symptoms:  Alcohol Use Disorder Identification Test Final Score (AUDIT): 19 The "Alcohol Use Disorders Identification Test", Guidelines for Use in Primary Care, Second Edition.  World Science writer [facility]). Score between 0-7:  no or low risk or alcohol related problems. Score between 8-15:  moderate risk of alcohol related problems. Score between 16-19:  high risk of alcohol related problems. Score 20 or above:  warrants further diagnostic  evaluation for alcohol dependence and treatment.   CLINICAL FACTORS:   MDD   Musculoskeletal: Strength & Muscle Tone: within normal limits Gait & Station: normal Patient leans: N/A  Psychiatric Specialty Exam: Physical Exam  Constitutional: He is oriented to person, place, and time. He appears well-developed and well-nourished.  HENT:  Head: Normocephalic and atraumatic.  Respiratory: Effort normal.  Neurological: He is alert and oriented to person, place, and time.  Psychiatric:  As above     ROS  Blood pressure (!) 124/92, pulse (!) 111, temperature 97.6 F (36.4 C), temperature source Oral, resp. rate 18, height 6' 1.75" (1.873 m), weight 117.9 kg (260 lb).Body mass index is 33.61 kg/m.  General Appearance: Neatly dressed, was playing cards with peers just prior to interview. Not shaky, not sweaty, not confused. Not unsteady, normal conjugate eye movements. Not internally distressed. Appropriate behavior.   Eye Contact:  Good  Speech:  Clear and Coherent and Normal Rate  Volume:  Normal  Mood:  Not objectively depressed  Affect:  Appropriate and Full Range  Thought Process:  Linear  Orientation:  Full (Time, Place, and Person)  Thought Content:  Rumination. No delusional theme. No preoccupation with violent thoughts. No hallucination in any modality.   Suicidal Thoughts:  None currently  Homicidal Thoughts:  No  Memory:  Immediate;   Good Recent;   Good Remote;   Good  Judgement:  Fair  Insight:  Present  Psychomotor Activity:  Normal  Concentration:  Concentration: Good and Attention Span: Good  Recall:  Good  Fund of Knowledge:  Good  Language:  Good  Akathisia:  Negative  Handed:    AIMS (if indicated):     Assets:  Communication Skills Desire for Improvement Physical Health Resilience  ADL's:  Intact  Cognition:  WNL  Sleep:  Number of Hours: 6.75      COGNITIVE FEATURES THAT CONTRIBUTE TO RISK:  None    SUICIDE RISK:   Minimal: No identifiable  suicidal ideation.  Patients presenting with no risk factors but with morbid ruminations; may be classified as minimal risk based on the severity of the depressive symptoms  PLAN OF CARE:  As in H&P  I certify that inpatient services furnished can reasonably be expected to improve the patient's condition.   Georgiann CockerVincent A Darlena Koval, MD 04/04/2017, 5:05 PM

## 2017-04-04 NOTE — [facility] Group Notes (Signed)
[facility] Mental [facility] [employer] Group Therapy 04/04/2017 1:15pm  Type of Therapy: Mental Health Association Presentation  Participation Level: Active  Participation Quality: Attentive  Affect: Appropriate  Cognitive: Oriented  Insight: Developing/Improving  Engagement in Therapy: Engaged  Modes of Intervention: Discussion, Education and Socialization  Summary of Progress/Problems: Mental Health Association (MHA) Speaker came to talk about his personal journey with mental health. The pt processed ways by which to relate to the speaker. MHA speaker provided handouts and educational information pertaining to groups and services offered by the MHA. Pt was engaged in speaker's presentation and was receptive to resources provided.    Terry Wells N Smart, LCSW 04/04/2017 3:24 PM  

## 2017-04-05 NOTE — [facility] Group Notes (Addendum)
[facility] Group Notes:  (Nursing/MHT/Case Management/Adjunct)  Date:  04/05/2017  Time:  1630  Type of Therapy:  Nurse Education - Promoting Wellness Through Positive Self Talk  Participation Level:  Active  Participation Quality:  Appropriate  Affect:  Animated  Cognitive:  Alert  Insight:  Lacking  Engagement in Group:  Engaged  Modes of Intervention:  Discussion, Education and Support  Summary of Progress/Problems: Patient attended and participated in group. Patients were taught how to rephrase negative self talk to promote positive self esteem and wellness. Patient continues to minimize alcohol use and states, "I'm probably going to continue to drink."  Lawrence MarseillesFriedman, [name] 04/05/2017, 5:31 PM

## 2017-04-05 NOTE — Progress Notes (Signed)
Patient was in bed at the beginning of this shift. He did not attend group.Although came for his bedtime medication. He received Trazodone as ordered for sleep. Patient denied SI/HI, denied hallucinations and denied withdrawal symptoms. Writer encouraged and supported patient. Q 15 minute checks continues as ordered for safety.

## 2017-04-05 NOTE — Tx Team (Signed)
Interdisciplinary Treatment and Diagnostic Plan Update  04/05/2017 Time of Session: 0830AM Terry Wells MRN: [id number]  Principal Diagnosis: MDD (major depressive disorder), severe (HCC)  Secondary Diagnoses: Principal Problem:   MDD (major depressive disorder), severe (HCC) Active Problems:   Alcohol dependence (HCC)   Current Medications:  Current Facility-Administered Medications  Medication Dose Route Frequency Provider Last Rate Last Dose  . acetaminophen (TYLENOL) tablet 650 mg  650 mg Oral Q6H PRN Thermon Leyland, NP      . alum & mag hydroxide-simeth (MAALOX/MYLANTA) 200-200-20 MG/5ML suspension 30 mL  30 mL Oral Q4H PRN Fransisca Kaufmann A, NP      . chlordiazePOXIDE (LIBRIUM) capsule 25 mg  25 mg Oral QID PRN Money, Gerlene Burdock, FNP      . citalopram (CELEXA) tablet 10 mg  10 mg Oral Daily Money, Gerlene Burdock, FNP   10 mg at 04/05/17 6213  . gabapentin (NEURONTIN) capsule 100 mg  100 mg Oral TID Money, Gerlene Burdock, FNP   100 mg at 04/05/17 0865  . hydrOXYzine (ATARAX/VISTARIL) tablet 25 mg  25 mg Oral Q6H PRN Thermon Leyland, NP   25 mg at 04/04/17 2143  . loperamide (IMODIUM) capsule 2-4 mg  2-4 mg Oral PRN Fransisca Kaufmann A, NP      . magnesium hydroxide (MILK OF MAGNESIA) suspension 30 mL  30 mL Oral Daily PRN Thermon Leyland, NP      . multivitamin with minerals tablet 1 tablet  1 tablet Oral Daily Thermon Leyland, NP   1 tablet at 04/05/17 0829  . ondansetron (ZOFRAN-ODT) disintegrating tablet 4 mg  4 mg Oral Q6H PRN Fransisca Kaufmann A, NP      . thiamine (B-1) injection 100 mg  100 mg Intramuscular Once Fransisca Kaufmann A, NP      . thiamine (VITAMIN B-1) tablet 100 mg  100 mg Oral Daily Fransisca Kaufmann A, NP   100 mg at 04/05/17 0829  . traZODone (DESYREL) tablet 50 mg  50 mg Oral QHS PRN Thermon Leyland, NP       PTA Medications: Medications Prior to Admission  Medication Sig Dispense Refill Last Dose  . amoxicillin (AMOXIL) 500 MG capsule Take 1 capsule (500 mg total) by mouth 3 (three) times  daily. (Patient not taking: Reported on 04/03/2017) 30 capsule 0 Not Taking at Unknown time  . azithromycin (ZITHROMAX) 250 MG tablet Take 1 tablet (250 mg total) by mouth daily. Take first 2 tablets together, then 1 every day until finished. (Patient not taking: Reported on 04/03/2017) 6 tablet 0 Not Taking at Unknown time  . benzonatate (TESSALON) 100 MG capsule Take 1 capsule (100 mg total) by mouth every 8 (eight) hours. (Patient not taking: Reported on 04/03/2017) 21 capsule 0 Not Taking at Unknown time  . HYDROcodone-acetaminophen (NORCO/VICODIN) 5-325 MG per tablet Take 1-2 tablets by mouth every 4 (four) hours as needed for moderate pain or severe pain. (Patient not taking: Reported on 04/03/2017) 15 tablet 0 Not Taking at Unknown time  . HYDROcodone-acetaminophen (NORCO/VICODIN) 5-325 MG tablet Take 1-2 tablets by mouth every 6 (six) hours as needed for moderate pain. (Patient not taking: Reported on 04/03/2017) 10 tablet 0 Not Taking at Unknown time  . lisinopril (PRINIVIL,ZESTRIL) 10 MG tablet Take 1 tablet (10 mg total) by mouth daily. 30 tablet 0   . loratadine-pseudoephedrine (CLARITIN-D 24-HOUR) 10-240 MG 24 hr tablet Take 1 tablet by mouth daily.   Past Month at Unknown time  . naproxen (  NAPROSYN) 500 MG tablet Take 1 tablet (500 mg total) by mouth 2 (two) times daily. (Patient not taking: Reported on 04/03/2017) 14 tablet 0 Not Taking at Unknown time  . predniSONE (DELTASONE) 20 MG tablet Take 2 tablets (40 mg total) by mouth daily. (Patient not taking: Reported on 04/03/2017) 10 tablet 0 Not Taking at Unknown time    Patient Stressors: Financial difficulties Substance abuse  Patient Strengths: Wellsite geologist fund of knowledge Motivation for treatment/growth Physical Health  Treatment Modalities: Medication Management, Group therapy, Case management,  1 to 1 session with clinician, Psychoeducation, Recreational therapy.   Physician Treatment Plan for Primary Diagnosis:  MDD (major depressive disorder), severe (HCC) Long Term Goal(s): Improvement in symptoms so as ready for discharge Improvement in symptoms so as ready for discharge   Short Term Goals: Ability to disclose and discuss suicidal ideas Compliance with prescribed medications will improve Ability to identify triggers associated with substance abuse/mental health issues will improve Ability to verbalize feelings will improve Ability to disclose and discuss suicidal ideas Ability to demonstrate self-control will improve  Medication Management: Evaluate patient's response, side effects, and tolerance of medication regimen.  Therapeutic Interventions: 1 to 1 sessions, Unit Group sessions and Medication administration.  Evaluation of Outcomes: Progressing  Physician Treatment Plan for Secondary Diagnosis: Principal Problem:   MDD (major depressive disorder), severe (HCC) Active Problems:   Alcohol dependence (HCC)  Long Term Goal(s): Improvement in symptoms so as ready for discharge Improvement in symptoms so as ready for discharge   Short Term Goals: Ability to disclose and discuss suicidal ideas Compliance with prescribed medications will improve Ability to identify triggers associated with substance abuse/mental health issues will improve Ability to verbalize feelings will improve Ability to disclose and discuss suicidal ideas Ability to demonstrate self-control will improve     Medication Management: Evaluate patient's response, side effects, and tolerance of medication regimen.  Therapeutic Interventions: 1 to 1 sessions, Unit Group sessions and Medication administration.  Evaluation of Outcomes: Progressing   RN Treatment Plan for Primary Diagnosis: MDD (major depressive disorder), severe (HCC) Long Term Goal(s): Knowledge of disease and therapeutic regimen to maintain health will improve  Short Term Goals: Ability to remain free from injury will improve, Ability to verbalize  feelings will improve and Ability to disclose and discuss suicidal ideas  Medication Management: RN will administer medications as ordered by provider, will assess and evaluate patient's response and provide education to patient for prescribed medication. RN will report any adverse and/or side effects to prescribing provider.  Therapeutic Interventions: 1 on 1 counseling sessions, Psychoeducation, Medication administration, Evaluate responses to treatment, Monitor vital signs and CBGs as ordered, Perform/monitor CIWA, COWS, AIMS and Fall Risk screenings as ordered, Perform wound care treatments as ordered.  Evaluation of Outcomes: Progressing   LCSW Treatment Plan for Primary Diagnosis: MDD (major depressive disorder), severe (HCC) Long Term Goal(s): Safe transition to appropriate next level of care at discharge, Engage patient in therapeutic group addressing interpersonal concerns.  Short Term Goals: Engage patient in aftercare planning with referrals and resources, Facilitate patient progression through stages of change regarding substance use diagnoses and concerns and Identify triggers associated with mental health/substance abuse issues  Therapeutic Interventions: Assess for all discharge needs, 1 to 1 time with Social worker, Explore available resources and support systems, Assess for adequacy in community support network, Educate family and significant other(s) on suicide prevention, Complete Psychosocial Assessment, Interpersonal group therapy.  Evaluation of Outcomes: Progressing   Progress in Treatment: Attending  groups: Yes. Participating in groups: Yes. Taking medication as prescribed: Yes. Toleration medication: Yes. Family/Significant other contact made: Yes, individual(s) contacted:  Trey PaulaJeff, Pt's brother Patient understands diagnosis: Yes. Discussing patient identified problems/goals with staff: Yes. Medical problems stabilized or resolved: Yes. Denies suicidal/homicidal  ideation: Yes. Issues/concerns per patient self-inventory: No. Other: n/a   New problem(s) identified: No, Describe:  n/a  New Short Term/Long Term Goal(s): detox, medication management for mood stabilization; elimination of SI thoughts; development of comprehensive mental wellness/sobriety plan.   Patient Goal: "to feel better and get help for my depression. I think I need medication and to learn better coping."   Discharge Plan or Barriers: CSW assessing for appropriate referrals. Pt plans [facility] return [facility]. [facility]AG pamphlet and AA/NA information provided to pt.   Reason for Continuation of Hospitalization: Depression Medication stabilization Withdrawal symptoms  Estimated Length of Stay: Friday, 04/07/17  Attendees: Patient: Terry GravesJonathan Wells  04/05/2017 8:46 AM  Physician: Dr. Altamese Carolinaainville MD; Dr. Jackquline BerlinIzediuno MD 04/05/2017 8:46 AM  Nursing: Armando ReichertMarian RN; Christa RN  04/05/2017 8:46 AM  RN Care Manager:x 04/05/2017 8:46 AM  Social Worker: Trula SladeHeather Smart, LCSW; Baldo DaubJolan Williams LCSWA 04/05/2017 8:46 AM  Recreational Therapist:x  04/05/2017 8:46 AM  Other: Armandina StammerAgnes Nwoko NP; Feliz Beamravis Money NP 04/05/2017 8:46 AM  Other:  04/05/2017 8:46 AM  Other: 04/05/2017 8:46 AM    Scribe for Treatment Team: Ledell PeoplesHeather N Smart, LCSW 04/05/2017 8:46 AM

## 2017-04-05 NOTE — Progress Notes (Signed)
[facility] MD Progress Note  04/05/2017 12:51 PM BRENTON JOINES  MRN:  [id number]   Subjective:  Patient reports that he is feeling really good today, except he did not sleep well. He rates his depression at 1/10 and anxiety at 0.He reports that he took a different medication last night for sleep and it did not work well. He denies any medication side effects. He denies any withdrawal symptoms. He denies any SI/HI/AVH and contracts for safety. He states that he only wants outpatient treatment and refuses residential treatment.    Objective: Patient's chart and findings reviewed and discussed with treatment team. Patient is pleasant and cooperative. He presents in the day room, where he spends most of his time. He is talkative and engages with peers and staff appropriately. Reviewed his medications and he did not take the Trazodone for sleep, but did take the Vistaril. He is encouraged to use Trazodone for sleep instead. He will continue current medications.   Principal Problem: MDD (major depressive disorder), severe (HCC) Diagnosis:   Patient Active Problem List   Diagnosis Date Noted  . Alcohol dependence (HCC) [F10.20] 04/04/2017  . MDD (major depressive disorder), severe (HCC) [F32.2] 04/03/2017   Total Time spent with patient: 15 minutes  Past Psychiatric History: See H&P  Past Medical History: History reviewed. No pertinent past medical history.  Past Surgical History:  Procedure Laterality Date  . WRIST SURGERY     Family History: History reviewed. No pertinent family history. Family Psychiatric  History: See H&P Social History:  Social History   Substance and Sexual Activity  Alcohol Use Yes     Social History   Substance and Sexual Activity  Drug Use No    Social History   Socioeconomic History  . Marital status: Married    Spouse name: None  . Number of children: None  . Years of education: None  . Highest education level: None  Social Needs  . Financial resource  strain: None  . Food insecurity - worry: None  . Food insecurity - inability: None  . Transportation needs - medical: None  . Transportation needs - non-medical: None  Occupational History  . None  Tobacco Use  . Smoking status: Never Smoker  . Smokeless tobacco: Never Used  Substance and Sexual Activity  . Alcohol use: Yes  . Drug use: No  . Sexual activity: Yes  Other Topics Concern  . None  Social History Narrative  . None   Additional Social History:                         Sleep: Fair  Appetite:  Good  Current Medications: Current Facility-Administered Medications  Medication Dose Route Frequency Provider Last Rate Last Dose  . acetaminophen (TYLENOL) tablet 650 mg  650 mg Oral Q6H PRN Thermon Leyland, NP      . alum & mag hydroxide-simeth (MAALOX/MYLANTA) 200-200-20 MG/5ML suspension 30 mL  30 mL Oral Q4H PRN Fransisca Kaufmann A, NP      . chlordiazePOXIDE (LIBRIUM) capsule 25 mg  25 mg Oral QID PRN Kieron Kantner, Gerlene Burdock, FNP      . citalopram (CELEXA) tablet 10 mg  10 mg Oral Daily Adilenne Ashworth, Gerlene Burdock, FNP   10 mg at 04/05/17 4098  . gabapentin (NEURONTIN) capsule 100 mg  100 mg Oral TID Glenford Garis, Feliz Beam B, FNP   100 mg at 04/05/17 1206  . hydrOXYzine (ATARAX/VISTARIL) tablet 25 mg  25 mg Oral Q6H PRN  Thermon Leylandavis, Laura A, NP   25 mg at 04/04/17 2143  . loperamide (IMODIUM) capsule 2-4 mg  2-4 mg Oral PRN Fransisca Kaufmannavis, Laura A, NP      . magnesium hydroxide (MILK OF MAGNESIA) suspension 30 mL  30 mL Oral Daily PRN Thermon Leylandavis, Laura A, NP      . multivitamin with minerals tablet 1 tablet  1 tablet Oral Daily Thermon Leylandavis, Laura A, NP   1 tablet at 04/05/17 0829  . ondansetron (ZOFRAN-ODT) disintegrating tablet 4 mg  4 mg Oral Q6H PRN Fransisca Kaufmannavis, Laura A, NP      . thiamine (B-1) injection 100 mg  100 mg Intramuscular Once Fransisca Kaufmannavis, Laura A, NP      . thiamine (VITAMIN B-1) tablet 100 mg  100 mg Oral Daily Fransisca Kaufmannavis, Laura A, NP   100 mg at 04/05/17 0829  . traZODone (DESYREL) tablet 50 mg  50 mg Oral QHS PRN Thermon Leylandavis,  Laura A, NP        Lab Results: No results found for this or any previous visit (from the past 48 hour(s)).  Blood Alcohol level:  Lab Results  Component Value Date   ETH <10 04/03/2017    Metabolic Disorder Labs: No results found for: HGBA1C, MPG No results found for: PROLACTIN No results found for: CHOL, TRIG, HDL, CHOLHDL, VLDL, LDLCALC  Physical Findings: AIMS: Facial and Oral Movements Muscles of Facial Expression: None, normal Lips and Perioral Area: None, normal Jaw: None, normal Tongue: None, normal,Extremity Movements Upper (arms, wrists, hands, fingers): None, normal Lower (legs, knees, ankles, toes): None, normal, Trunk Movements Neck, shoulders, hips: None, normal, Overall Severity Severity of abnormal movements (highest score from questions above): None, normal Incapacitation due to abnormal movements: None, normal Patient's awareness of abnormal movements (rate only patient's report): No Awareness, Dental Status Current problems with teeth and/or dentures?: No Does patient usually wear dentures?: No  CIWA:  CIWA-Ar Total: 2 COWS:     Musculoskeletal: Strength & Muscle Tone: within normal limits Gait & Station: normal Patient leans: N/A  Psychiatric Specialty Exam: Physical Exam  Nursing note and vitals reviewed. Constitutional: He is oriented to person, place, and time. He appears well-developed and well-nourished.  Respiratory: Effort normal.  Musculoskeletal: Normal range of motion.  Neurological: He is alert and oriented to person, place, and time.  Skin: Skin is warm.    Review of Systems  Constitutional: Negative.   HENT: Negative.   Eyes: Negative.   Respiratory: Negative.   Cardiovascular: Negative.   Gastrointestinal: Negative.   Genitourinary: Negative.   Musculoskeletal: Negative.   Skin: Negative.   Neurological: Negative.   Endo/Heme/Allergies: Negative.   Psychiatric/Behavioral: Positive for depression and substance abuse. Negative  for hallucinations and suicidal ideas. The patient is not nervous/anxious.     Blood pressure (!) 141/96, pulse (!) 102, temperature 97.7 F (36.5 C), temperature source Oral, resp. rate 18, height 6' 1.75" (1.873 m), weight 117.9 kg (260 lb).Body mass index is 33.61 kg/m.  General Appearance: Casual  Eye Contact:  Good  Speech:  Clear and Coherent and Normal Rate  Volume:  Normal  Mood:  Euthymic  Affect:  Congruent  Thought Process:  Goal Directed and Descriptions of Associations: Intact  Orientation:  Full (Time, Place, and Person)  Thought Content:  WDL  Suicidal Thoughts:  No  Homicidal Thoughts:  No  Memory:  Immediate;   Good Recent;   Good Remote;   Good  Judgement:  Good  Insight:  Good  Psychomotor Activity:  Normal  Concentration:  Concentration: Good and Attention Span: Good  Recall:  Good  Fund of Knowledge:  Good  Language:  Good  Akathisia:  No  Handed:  Right  AIMS (if indicated):     Assets:  Communication Skills Desire for Improvement Financial Resources/Insurance Housing Physical Health Social Support Transportation  ADL's:  Intact  Cognition:  WNL  Sleep:  Number of Hours: 6.75   Problems Addressed: MDD severe Alcohol dependence  Treatment Plan Summary: Daily contact with patient to assess and evaluate symptoms and progress in treatment, Medication management and Plan is to:  -Continue Celexa 10 mg PO Daily for mood stability -Continue Gabapentin 100 mg PO TID for withdrawal symptoms -Continue Vistaril 25 mg PO Q6H PRN for anxiety -Continue Trazodone 50 mg PO QHS PRN for insomnia -Continue Librium QID PRN for CIWA >10 -Encourage group therapy participation  Maryfrances Bunnell, FNP 04/05/2017, 12:51 PM

## 2017-04-05 NOTE — Progress Notes (Signed)
Recreation Therapy Notes  Date: 04/05/17 Time: 0930 Location: [address] Dayroom  Group Topic: Stress Management  Goal Area(s) Addresses:  Patient will verbalize importance of using healthy stress management.  Patient will identify positive emotions associated with healthy stress management.   Behavioral Response: Engaged  Intervention: Stress Management  Activity :  Progressive Muscle Relaxation.  LRT introduced the stress management technique of progressive muscle relaxation.  LRT read a script to guide the patients through the process of tensing and relaxing each muscle group individually.    Education:  Stress Management, Discharge Planning.   Education Outcome: Acknowledges edcuation/In group clarification offered/Needs additional education  Clinical Observations/Feedback: Pt attended group.     Caroll RancherMarjette Laurina Fischl, LRT/CTRS     Caroll RancherLindsay, Merlinda Wrubel A 04/05/2017 11:48 AM

## 2017-04-05 NOTE — Plan of Care (Signed)
Patient verbalizes understanding of information, education provided. 

## 2017-04-05 NOTE — BHH Group Notes (Signed)
Adult Psychoeducational Group Note  Date:  04/05/2017 Time:  10:26 AM  Group Topic/Focus:  Personal Choices and Values:   The focus of this group is to help patients assess and explore the importance of values in their lives, how their values affect their decisions, how they express their values and what opposes their expression.  Participation Level:  Active  Participation Quality:  Appropriate  Affect:  Appropriate  Cognitive:  Appropriate  Insight: Appropriate  Engagement in Group:  Improving  Modes of Intervention:  Clarification  Additional Comments:    Terry BeersRodney S Klaus Wells 04/05/2017, 10:26 AM

## 2017-04-05 NOTE — Progress Notes (Signed)
D: Patient observed up and visible in the milieu. Patient denies any concerns or complaints this AM. Patient's affect animated, mood appropriate. Patient minimal, forwards little information. Nonchalant and minimizes SI PTA. Per self inventory and discussions with writer, rates depression at a 0/10, hopelessness at a 1/10 and anxiety at a 0/10. Rates sleep as poor, appetite as good, energy as low and concentration as 0.  States goal for today is "being happy." Denies pain, physical complaints. Denies withdrawal thus far today.  A: Medicated per orders, no prns requested or required. Level III obs in place for safety. Emotional support offered and self inventory reviewed. Encouraged completion of Suicide Safety Plan and programming participation. Discussed POC with MD, SW.    R: Patient verbalizes understanding of POC. Patient denies SI/HI/AVH and remains safe on level III obs. Will continue to monitor closely and make verbal contact frequently.

## 2017-04-05 NOTE — BHH Group Notes (Signed)
LCSW Group Therapy Note 04/05/2017 12:56 PM  Type of Therapy/Topic: Group Therapy: Feelings about Diagnosis  Participation Level: Active   Description of Group:  This group will allow patients to explore their thoughts and feelings about diagnoses they have received. Patients will be guided to explore their level of understanding and acceptance of these diagnoses. Facilitator will encourage patients to process their thoughts and feelings about the reactions of others to their diagnosis and will guide patients in identifying ways to discuss their diagnosis with significant others in their lives. This group will be process-oriented, with patients participating in exploration of their own experiences, giving and receiving support, and processing challenge from other group members.  Therapeutic Goals: 1. Patient will demonstrate understanding of diagnosis as evidenced by identifying two or more symptoms of the disorder 2. Patient will be able to express two feelings regarding the diagnosis 3. Patient will demonstrate their ability to communicate their needs through discussion and/or role play  Summary of Patient Progress:  Terry Wells was engaged throughout the group session. He participated and contributed to the group's discussion. Terry Wells states that he became anxious when he learned he had a mental health diagnosis. He states that it caused him to be anxious because he had no knowledge on how to manage a diagnosis. Terry Wells states that he is taking it day by day, and that he does not know his plans going forward at this time.      Therapeutic Modalities:  Cognitive Behavioral Therapy Brief Therapy Feelings Identification    Terry Wells LCSWA Clinical Social Worker

## 2017-04-06 MED ORDER — GABAPENTIN 100 MG PO CAPS: 100.0000 mg | ORAL_CAPSULE | Freq: Three times a day (TID) | ORAL | 0 refills | Status: AC

## 2017-04-06 MED ORDER — LISINOPRIL 10 MG PO TABS: 10.0000 mg | ORAL_TABLET | Freq: Every day | ORAL | 0 refills | Status: AC

## 2017-04-06 MED ORDER — TRAZODONE HCL 50 MG PO TABS: 50.0000 mg | ORAL_TABLET | Freq: Every evening | ORAL | 0 refills | Status: AC | PRN

## 2017-04-06 MED ORDER — CITALOPRAM HYDROBROMIDE 10 MG PO TABS: 10.0000 mg | ORAL_TABLET | Freq: Every day | ORAL | 0 refills | Status: AC

## 2017-04-06 MED ORDER — LISINOPRIL 10 MG PO TABS
10.0000 mg | ORAL_TABLET | Freq: Every day | ORAL | Status: DC
  Administered 2017-04-06: 10 mg via ORAL
  Filled 2017-04-06 (×2): qty 7
  Filled 2017-04-06: qty 1
  Filled 2017-04-06: qty 2

## 2017-04-06 MED ORDER — HYDROXYZINE HCL 25 MG PO TABS: 25.0000 mg | ORAL_TABLET | Freq: Four times a day (QID) | ORAL | 0 refills | Status: AC | PRN

## 2017-04-06 NOTE — Progress Notes (Signed)
  [facility] :  Will you be returning to the same living situation after discharge:  Yes,  home At discharge, do you have transportation home?: Yes,  family member or bus Do you have the ability to pay for your medications: Yes,  mental health  Release of information consent forms completed and submitted to medical records by CSW.  Patient to Follow up at: Follow-up Information    [facility] Follow up on 04/11/2017.   Specialty:  Behavioral Health Why:  Hospital follow-up on Tuesday, 3/5 at 12:15PM. Please bring hospital discharge paperwork with you to this appt. Thank you.  Contact information: [address] [place] [address] [phone]           Next level of care provider [facility] Link:no  Safety Planning and Suicide Prevention discussed: Yes,  SPE completed with pt's brother. SPI pamphlet and Mobile crisis information provided to pt.   Have you used any form of tobacco in the last 30 days? (Cigarettes, Smokeless Tobacco, Cigars, and/or Pipes): No  Has patient been referred to the Quitline?: N/A patient is not a smoker  Patient has been referred for addiction treatment: Yes  Pulte HomesHeather N Smart, LCSW 04/06/2017, 9:11 AM

## 2017-04-06 NOTE — B[facility] Suicide Risk Assessment (Signed)
[facility]   Principal Problem: MDD (major depressive disorder), severe [facility](HCC) Discharge Diagnoses:  Patient Active Problem List   Diagnosis Date Noted  . Alcohol dependence (HCC) [F10.20] 04/04/2017  . MDD (major depressive disorder), severe (HCC) [F32.2] 04/03/2017    Total Time spent with patient: 45 minutes  Musculoskeletal: Strength & Muscle Tone: within normal limits Gait & Station: normal Patient leans: N/A  Psychiatric Specialty Exam: Review of Systems  Constitutional: Negative.   HENT: Negative.   Eyes: Negative.   Respiratory: Negative.   Cardiovascular: Negative.   Gastrointestinal: Negative.   Genitourinary: Negative.   Musculoskeletal: Negative.   Skin: Negative.   Neurological: Negative.   Endo/Heme/Allergies: Negative.   Psychiatric/Behavioral: Negative for depression, hallucinations, memory loss, substance abuse and suicidal ideas. The patient is not nervous/anxious and does not have insomnia.     Blood pressure (!) 141/96, pulse 76, temperature (!) 97.4 F (36.3 C), temperature source Oral, resp. rate 16, height 6' 1.75" (1.873 m), weight 117.9 kg (260 lb).Body mass index is 33.61 kg/m.  General Appearance: Neatly dressed, pleasant, engaging well and cooperative. Appropriate behavior. Not in any distress. Good relatedness. Not internally stimulated  Eye Contact::  Good  Speech:  Spontaneous, normal prosody. Normal tone and rate.   Volume:  Normal  Mood:  Euthymic  Affect:  Appropriate and Full Range  Thought Process:  Linear  Orientation:  Full (Time, Place, and Person)  Thought Content:  No delusional theme. No preoccupation with violent thoughts. No negative ruminations. No obsession.  No hallucination in any modality.   Suicidal Thoughts:  No  Homicidal Thoughts:  No  Memory:  Immediate;   Good Recent;   Good Remote;   Good  Judgement:  Good  Insight:  Good  Psychomotor Activity:  Normal  Concentration:  Good  Recall:   Good  Fund of Knowledge:Good  Language: Good  Akathisia:  Negative  Handed:    AIMS (if indicated):     Assets:  Communication Skills Desire for Improvement Physical Health Resilience  Sleep:  Number of Hours: 6.5  Cognition: WNL  ADL's:  Intact   Clinical Assessment::   47 y.o Caucasian male, divorced, unemployed, recently lost his housing. Background history of SUD. Presented to the unit with thoughts of suicide. Had plans to drink cleaning supplies. He has not kept a steady job lately. Says he has been struggling. [place] Says he hopes to live with his brother upon discharge. Routine labs are essentially normal. Toxicology is negative. UDS is negative. No alcohol. No past suicidal behavior.   Seen today. No residual withdrawal symptoms. Reports that he is in good spirits. Not feeling depressed. Reports normal energy and interest. Has been maintaining normal biological functions. He is able to think clearly. He is able to focus on task. His thoughts are not crowded or racing. No evidence of mania. No hallucination in any modality. He is not making any delusional statement. No passivity of will/thought. He is fully in touch with reality. No thoughts of suicide. No thoughts of homicide. No violent thoughts. No overwhelming anxiety. No access to weapons.   Nursing staff reports that patient has been appropriate on the unit. Patient has been interacting well with peers. No behavioral issues. Patient has not voiced any suicidal thoughts. Patient has not been observed to be internally stimulated. Patient has been adherent with treatment recommendations. Patient has been tolerating their medication well.   Patient was discussed at team. Team members feels  that patient is back to his baseline level of function. Team agrees with plan to discharge patient today.   Demographic Factors:  Divorced or widowed and Unemployed  Loss Factors: Decrease in vocational  status  Historical Factors: Impulsivity  Risk Reduction Factors:   Sense of responsibility to family, Positive social support, Positive therapeutic relationship and Positive coping skills or problem solving skills  Continued Clinical Symptoms:  As above   Cognitive Features That Contribute To Risk:  None    Suicide Risk:  Minimal: No identifiable suicidal ideation. Patient is not having any thoughts of suicide at this time. Modifiable risk factors targeted during this admission includes depression and substance use. Demographical and historical risk factors cannot be modified. Patient is now engaging well. Patient is reliable and is future oriented. We have buffered patient's support structures. At this point, patient is at low risk of suicide. Patient is aware of the effects of psychoactive substances on decision making process. Patient has been provided with emergency contacts. Patient acknowledges to use resources provided if unforseen circumstances changes their current risk stratification.    Follow-up Information    [facility] Follow up on 04/11/2017.   Specialty:  Behavioral Health Why:  Hospital follow-up on Tuesday, 3/5 at 12:15PM. Please bring hospital discharge paperwork with you to this appt. Thank you.  Contact information: [address] [place] [address] [phone]           Plan Of Care/Follow-up recommendations:  1. Continue current psychotropic medications 2. Mental health and addiction follow up as arranged.  3. Discharge in care of his family 4. Provided limited quantity of prescriptions   Georgiann Cocker, MD 04/06/2017, 10:09 AM

## 2017-04-06 NOTE — Discharge Summary (Signed)
Physician Discharge Summary Note  Patient:  Terry Wells is an 47 y.o., male MRN:  [id number] DOB:  11/23/1970 Patient phone:  [phone] (home)  Patient address:   [address] [place] [id number],  Total Time spent with patient: 20 minutes  Date of Admission:  04/03/2017 Date of Discharge: 04/06/17  Reason for Admission:  Worsening depression with SI and plan  Principal Problem: MDD (major depressive disorder), severe [facility](HCC) Discharge Diagnoses: Patient Active Problem List   Diagnosis Date Noted  . Alcohol dependence (HCC) [F10.20] 04/04/2017  . MDD (major depressive disorder), severe (HCC) [F32.2] 04/03/2017    Past Psychiatric History: Denies psychiatric history, long history of alcoholism  Past Medical History: History reviewed. No pertinent past medical history.  Past Surgical History:  Procedure Laterality Date  . WRIST SURGERY     Family History: History reviewed. No pertinent family history. Family Psychiatric  History: Significant family history of alcohol abuse  Social History:  Social History   Substance and Sexual Activity  Alcohol Use Yes     Social History   Substance and Sexual Activity  Drug Use No    Social History   Socioeconomic History  . Marital status: Married    Spouse name: None  . Number of children: None  . Years of education: None  . Highest education level: None  Social Needs  . Financial resource strain: None  . Food insecurity - worry: None  . Food insecurity - inability: None  . Transportation needs - medical: None  . Transportation needs - non-medical: None  Occupational History  . None  Tobacco Use  . Smoking status: Never Smoker  . Smokeless tobacco: Never Used  Substance and Sexual Activity  . Alcohol use: Yes  . Drug use: No  . Sexual activity: Yes  Other Topics Concern  . None  Social History Narrative  . None    Hospital Course:   [facility] Assessment: 47  y.[facility]ep[facility] and [facility] for SI. Pt reports he wants to end his life with with a plan to poison himself with cleaning Supplies in his home. Pt reports he has no reason to live. Pt reports he know he will kill himself as soon as he can "work up the courage it's just a matter of time. Pt denies homicidal thoughts or physical aggression. Pt denies having access to firearms. Pt denies having any legal problems at this time.Pt denies hallucinations. Pt does not appear to be responding to internal stimuli and exhibits no delusional thought. Pt's reality testing appears to be intact. Pt denies SA but reports "I probably do drink too much booze at times".  Pt states "life is his stressor'. Pt reports he has a brother that supports him. Pt reports he can't hold a job longer than a few weeks. Pt denies any legal issues att his time. Pt denies hx of inpatient hospitalization and outpatient services. Pt is dressed in street clothes and sunglasses reporting "the light hurts my eyes". Pt isalert, oriented x4 with normal speech andnormalmotor behavior. Eye contact isfairand Pt is sad. Pt's mood is depressed and affect is anxious. Thought process is coherent and relevant. Pt's insight ispoorand judgement is impaired. There is no indication Pt is currently responding to internal stimuli or experiencing delusional thought content. Pt was cooperative throughout assessment. He says he is willing to sign voluntarily into a psychiatric facility  04[facility]04/17 [facility] Assessment: Patient seen today and confirms the above except for  reporting that he drinks 9-10 beers a day. He reports that he has stopped drinking in the past and his withdrawal symptoms are always mild. He denies any current withdrawal symptoms. He reports chronic depression, but has never been through any treatment and has never been on any psychiatric medications. He reports going to AA 2 different times in 2008 when he lived in  [place]. He lives in an apartment alone. He has 2 children taht he has a good relationship with, they are 61 and 46 years old. He denies any family history, except for alcohol abuse. He is currently unemployed.   Patient remained on the [facility] unit for 2 days and stabilized with sobriety, medication, and therapy. Patient was started on Celexa 10 mg Daily, Gabapentin 100 mg TID, Vistaril 25 mg Q6H PRN, and Trazodone 50 mg QHS PRN. He has shown improvement with improved mood, affect, sleep, appetite, and interaction. Patient has been seen in the day room interacting with peers and staff appropriately. Patient has been attending group and participating. Patient denies any SI/HI/AVH and contracts for safety. Patient agrees to follow up at [facility]. Patient is provided with prescriptions and samples for his medications upon discharge.    Physical Findings: AIMS: Facial and Oral Movements Muscles of Facial Expression: None, normal Lips and Perioral Area: None, normal Jaw: None, normal Tongue: None, normal,Extremity Movements Upper (arms, wrists, hands, fingers): None, normal Lower (legs, knees, ankles, toes): None, normal, Trunk Movements Neck, shoulders, hips: None, normal, Overall Severity Severity of abnormal movements (highest score from questions above): None, normal Incapacitation due to abnormal movements: None, normal Patient's awareness of abnormal movements (rate only patient's report): No Awareness, Dental Status Current problems with teeth and/or dentures?: No Does patient usually wear dentures?: No  CIWA:  CIWA-Ar Total: 0 COWS:     Musculoskeletal: Strength & Muscle Tone: within normal limits Gait & Station: normal Patient leans: N/A  Psychiatric Specialty Exam: Physical Exam  Nursing note and vitals reviewed. Constitutional: He is oriented to person, place, and time. He appears well-developed and well-nourished.  Cardiovascular: Normal rate.  Respiratory: Effort normal.   Musculoskeletal: Normal range of motion.  Neurological: He is alert and oriented to person, place, and time.  Skin: Skin is warm.    Review of Systems  Constitutional: Negative.   HENT: Negative.   Eyes: Negative.   Respiratory: Negative.   Cardiovascular: Negative.   Gastrointestinal: Negative.   Genitourinary: Negative.   Musculoskeletal: Negative.   Skin: Negative.   Neurological: Negative.   Endo/Heme/Allergies: Negative.   Psychiatric/Behavioral: Negative.     Blood pressure (!) 141/96, pulse 76, temperature (!) 97.4 F (36.3 C), temperature source Oral, resp. rate 16, height 6' 1.75" (1.873 m), weight 117.9 kg (260 lb).Body mass index is 33.61 kg/m.  General Appearance: Casual  Eye Contact:  Good  Speech:  Clear and Coherent and Normal Rate  Volume:  Normal  Mood:  Euthymic  Affect:  Appropriate  Thought Process:  Goal Directed and Descriptions of Associations: Intact  Orientation:  Full (Time, Place, and Person)  Thought Content:  WDL  Suicidal Thoughts:  No  Homicidal Thoughts:  No  Memory:  Immediate;   Good Recent;   Good Remote;   Good  Judgement:  Good  Insight:  Good  Psychomotor Activity:  Normal  Concentration:  Concentration: Good and Attention Span: Good  Recall:  Good  Fund of Knowledge:  Good  Language:  Good  Akathisia:  No  Handed:  Right  AIMS (if  indicated):     Assets:  Communication Skills Desire for Improvement Financial Resources/Insurance Housing Physical Health Social Support Transportation  ADL's:  Intact  Cognition:  WNL  Sleep:  Number of Hours: 6.5     Have you used any form of tobacco in the last 30 days? (Cigarettes, Smokeless Tobacco, Cigars, and/or Pipes): No  Has this patient used any form of tobacco in the last 30 days? (Cigarettes, Smokeless Tobacco, Cigars, and/or Pipes) Yes, No  Blood Alcohol level:  Lab Results  Component Value Date   ETH <10 04/03/2017    Metabolic Disorder Labs:  No results found for:  HGBA1C, MPG No results found for: PROLACTIN No results found for: CHOL, TRIG, HDL, CHOLHDL, VLDL, LDLCALC  See Psychiatric Specialty Exam and Suicide Risk Assessment completed by Attending Physician prior to discharge.  Discharge destination:  Home  Is patient on multiple antipsychotic therapies at discharge:  No   Has Patient had three or more failed trials of antipsychotic monotherapy by history:  No  Recommended Plan for Multiple Antipsychotic Therapies: NA   Allergies as of 04/06/2017   No Known Allergies     Medication List    STOP taking these medications   amoxicillin 500 MG capsule Commonly known as:  AMOXIL   azithromycin 250 MG tablet Commonly known as:  ZITHROMAX   benzonatate 100 MG capsule Commonly known as:  TESSALON   HYDROcodone-acetaminophen 5-325 MG tablet Commonly known as:  NORCO/VICODIN   loratadine-pseudoephedrine 10-240 MG 24 hr tablet Commonly known as:  CLARITIN-D 24-hour   naproxen 500 MG tablet Commonly known as:  NAPROSYN   predniSONE 20 MG tablet Commonly known as:  DELTASONE     TAKE these medications     Indication  citalopram 10 MG tablet Commonly known as:  CELEXA Take 1 tablet (10 mg total) by mouth daily. For mood control Start taking on:  04/07/2017  Indication:  mood stability   gabapentin 100 MG capsule Commonly known as:  NEURONTIN Take 1 capsule (100 mg total) by mouth 3 (three) times daily. For withdrawal symptoms  Indication:  Alcohol Withdrawal Syndrome   hydrOXYzine 25 MG tablet Commonly known as:  ATARAX/VISTARIL Take 1 tablet (25 mg total) by mouth every 6 (six) hours as needed for anxiety.  Indication:  Feeling Anxious   lisinopril 10 MG tablet Commonly known as:  PRINIVIL,ZESTRIL Take 1 tablet (10 mg total) by mouth daily. For high blood pressure What changed:  additional instructions  Indication:  High Blood Pressure Disorder   traZODone 50 MG tablet Commonly known as:  DESYREL Take 1 tablet (50 mg  total) by mouth at bedtime as needed for sleep.  Indication:  Trouble Sleeping      Follow-up Information    [facility] Follow up on 04/11/2017.   Specialty:  Behavioral Health Why:  Hospital follow-up on Tuesday, 3/5 at 12:15PM. Please bring hospital discharge paperwork with you to this appt. Thank you.  [address] [place] [address] [phone]           Follow-up recommendations:  Continue activity as tolerated. Continue diet as recommended by your PCP. Ensure to keep all appointments with outpatient providers.  Comments:  Patient is instructed prior to discharge to: Take all medications as prescribed by his/her mental healthcare provider. Report any adverse effects and or reactions from the medicines to his/her outpatient provider promptly. Patient has been instructed & cautioned: To not engage in alcohol and or illegal drug use while on prescription  medicines. In the event of worsening symptoms, patient is instructed to call the crisis hotline, 911 and or go to the nearest ED for appropriate evaluation and treatment of symptoms. To follow-up with his/her primary care provider for your other medical issues, concerns and or health care needs.    Signed: Gerlene Burdock Alexandre Faries, FNP 04/06/2017, 9:19 AM

## 2017-04-06 NOTE — Progress Notes (Signed)
Patient verbalizes readiness for discharge. Follow up plan explained, AVS, transition record and SRA given along with prescriptions. Sample meds provided. All belongings returned. Suicide Safety Plan completed, on chart and copy given to patient. Patient verbalizes understanding. Denies SI/HI and assures this Clinical research associatewriter he will seek assistance should that change. Patient discharged ambulatory and in stable condition to. Car keys returned, patient driving self home.

## 2017-04-06 NOTE — Progress Notes (Signed)
Reviewed BP readings upon NP's return to unit. Patient cleared for discharge as we have restarted his lisinopril. Patient asymptomatic and states he will be compliant with medication.

## 2017-04-06 NOTE — Progress Notes (Signed)
Pt attended morning Orientation/Daily Goals Group and stated that for the day he would like to work on learning more about having a better state of mind and being at peace with situations that arise. Pt is aware of the rules of the unit and has agreed to abide by them.

## 2017-04-06 NOTE — Progress Notes (Signed)
D: Patient observed up and visible in the milieu. Patient states he is doing well and has no concerns. Patient's affect animated, mood appropriate. Per self inventory and discussions with writer, rates depression, hopelessness and anxiety all at a 0/10. Rates sleep as good, appetite as good, energy as normal and concentration as good.  States goal for today is "life, one day at a time." Denies pain, physical complaints.   A: Medicated per orders, no prns requested or required. Level III obs in place for safety. Emotional support offered and self inventory reviewed. Encouraged completion of Suicide Safety Plan and programming participation. Discussed POC with MD, SW.    R: Patient verbalizes understanding of POC. Patient denies SI/HI/AVH and remains safe on level III obs. Will continue to monitor closely and make verbal contact frequently. Awaiting discharge.

## 2019-12-20 ENCOUNTER — Other Ambulatory Visit: Payer: Self-pay

## 2019-12-20 ENCOUNTER — Encounter (HOSPITAL_COMMUNITY): Payer: Self-pay | Admitting: Emergency Medicine

## 2019-12-20 ENCOUNTER — Emergency Department (HOSPITAL_COMMUNITY)
Admission: EM | Admit: 2019-12-20 | Discharge: 2019-12-20 | Disposition: A | Discharge status: Home / Self Care | Payer: Self-pay | Attending: Emergency Medicine | Admitting: Emergency Medicine

## 2019-12-20 DIAGNOSIS — I10 Essential (primary) hypertension: Secondary | ICD-10-CM | POA: Insufficient documentation

## 2019-12-20 DIAGNOSIS — M5481 Occipital neuralgia: Secondary | ICD-10-CM | POA: Insufficient documentation

## 2019-12-20 HISTORY — DX: Essential (primary) hypertension: I10

## 2019-12-20 MED ORDER — PREDNISONE 20 MG PO TABS
60.0000 mg | ORAL_TABLET | Freq: Once | ORAL | Status: AC
  Administered 2019-12-20: 21:00:00 60 mg via ORAL
  Filled 2019-12-20: qty 3

## 2019-12-20 MED ORDER — NAPROXEN 500 MG PO TABS: 500.0000 mg | ORAL_TABLET | Freq: Two times a day (BID) | ORAL | 0 refills | Status: AC

## 2019-12-20 NOTE — ED Triage Notes (Signed)
Patient complains of R sided neck pain x3 days, has tried stretches and repositioning w/o relief. Denies falls, trauma, spinal issues or neck issues.

## 2019-12-20 NOTE — Discharge Instructions (Signed)
You were evaluated in the Emergency Department and after careful evaluation, we did not find any emergent condition requiring admission or further testing in the hospital.  Your exam/testing today was overall reassuring.  Pain seems to be due to an inflamed nerve on the back of your head.  Please take the Naprosyn medication as directed and use warm compresses to the area.  This should improve over the next few days.  If your symptoms persist for over 2 weeks, we recommend follow-up with the neurologist.  Please return to the Emergency Department if you experience any worsening of your condition.  Thank you for allowing [place] to be a part of your care.

## 2019-12-20 NOTE — ED Provider Notes (Signed)
[facility]-EMERGENCY DEPT [facility] Emergency Department Provider Note MRN:  [id number]  Arrival date & time: 12/20/19     Chief Complaint   Neck pain History of Present Illness   Terry Wells is a 49 y.o. year-old male with a history of hypertension presenting to the ED with chief complaint of neck pain.  Pain is located at the base of the back of the head on the right, present for the past 2 or 3 days, severe and much worse with palpation to the area.  No pain with movement of the neck, no numbness or weakness to the arms or legs, no bowel or bladder dysfunction.  No trauma.  No fever, no chest pain or shortness of breath.  Review of Systems  A complete 10 system review of systems was obtained and all systems are negative except as noted in the HPI and PMH.   Patient's Health History    Past Medical History:  Diagnosis Date  . Hypertension     Past Surgical History:  Procedure Laterality Date  . WRIST SURGERY      No family history on file.  Social History   Socioeconomic History  . Marital status: Married    Spouse name: Not on file  . Number of children: Not on file  . Years of education: Not on file  . Highest education level: Not on file  Occupational History  . Not on file  Tobacco Use  . Smoking status: Never Smoker  . Smokeless tobacco: Never Used  Vaping Use  . Vaping Use: Never used  Substance and Sexual Activity  . Alcohol use: Yes  . Drug use: No  . Sexual activity: Yes  Other Topics Concern  . Not on file  Social History Narrative  . Not on file   Social Determinants of Health   Financial Resource Strain:   . Difficulty of Paying Living Expenses: Not on file  Food Insecurity:   . Worried About Programme researcher, broadcasting/film/video in the Last Year: Not on file  . Ran Out of Food in the Last Year: Not on file  Transportation Needs:   . Lack of Transportation (Medical): Not on file  . Lack of Transportation (Non-Medical): Not on file  Physical Activity:     . Days of Exercise per Week: Not on file  . Minutes of Exercise per Session: Not on file  Stress:   . Feeling of Stress : Not on file  Social Connections:   . Frequency of Communication with Friends and Family: Not on file  . Frequency of Social Gatherings with Friends and Family: Not on file  . Attends Religious Services: Not on file  . Active Member of Clubs or Organizations: Not on file  . Attends Banker Meetings: Not on file  . Marital Status: Not on file  Intimate Partner Violence:   . Fear of Current or Ex-Partner: Not on file  . Emotionally Abused: Not on file  . Physically Abused: Not on file  . Sexually Abused: Not on file     Physical Exam   Vitals:   12/20/19 2000 12/20/19 2001  BP: (!) 189/127 (!) 177/122  Pulse: 83 86  Resp: 18   Temp: 98 F (36.7 C)   SpO2: 100% 100%    CONSTITUTIONAL: Well-appearing, NAD NEURO:  Alert and oriented x 3, no focal deficits EYES:  eyes equal and reactive ENT/NECK:  no LAD, no JVD CARDIO: Regular rate, well-perfused, normal S1 and S2 PULM:  CTAB no wheezing or rhonchi GI/GU:  normal bowel sounds, non-distended, non-tender MSK/SPINE:  No gross deformities, no edema SKIN:  no rash, atraumatic PSYCH:  Appropriate speech and behavior  *Additional and/or pertinent findings included in MDM below  Diagnostic and Interventional Summary    EKG Interpretation  Date/Time:    Ventricular Rate:    PR Interval:    QRS Duration:   QT Interval:    QTC Calculation:   R Axis:     Text Interpretation:        Labs Reviewed - No data to display  No orders to display    Medications  predniSONE (DELTASONE) tablet 60 mg (has no administration in time range)     Procedures  /  Critical Care Procedures  ED Course and Medical Decision Making  I have reviewed the triage vital signs, the nursing notes, and pertinent available records from the EMR.  Listed above are laboratory and imaging tests that I personally  ordered, reviewed, and interpreted and then considered in my medical decision making (see below for details).  History and location of pain with reproducibility consistent with occipital neuralgia.  Appropriate for discharge with anti-inflammatory management.       Elmer Sow. Pilar Plate, MD [facility] Emergency Medicine [facility] [email or web address]  Final Clinical Impressions(s) / ED Diagnoses     ICD-10-CM   1. Occipital neuralgia of right side  M54.81     ED Discharge Orders         Ordered    naproxen (NAPROSYN) 500 MG tablet  2 times daily        12/20/19 2106           Discharge Instructions Discussed with and Provided to Patient:     Discharge Instructions     You were evaluated in the Emergency Department and after careful evaluation, we did not find any emergent condition requiring admission or further testing in the hospital.  Your exam/testing today was overall reassuring.  Pain seems to be due to an inflamed nerve on the back of your head.  Please take the Naprosyn medication as directed and use warm compresses to the area.  This should improve over the next few days.  If your symptoms persist for over 2 weeks, we recommend follow-up with the neurologist.  Please return to the Emergency Department if you experience any worsening of your condition.  Thank you for allowing [place] to be a part of your care.       Sabas Sous, MD 12/20/19 2108

## 2020-06-16 ENCOUNTER — Other Ambulatory Visit: Payer: Self-pay

## 2020-06-16 ENCOUNTER — Ambulatory Visit: Payer: Self-pay | Attending: Nurse Practitioner | Admitting: Nurse Practitioner

## 2023-09-26 ENCOUNTER — Encounter (HOSPITAL_COMMUNITY): Payer: Self-pay

## 2023-09-26 ENCOUNTER — Other Ambulatory Visit: Payer: Self-pay

## 2023-09-26 ENCOUNTER — Emergency Department (HOSPITAL_COMMUNITY)
Admission: EM | Admit: 2023-09-26 | Discharge: 2023-09-26 | Disposition: A | Discharge status: Home / Self Care | Payer: Worker's Compensation | Attending: Emergency Medicine | Admitting: Emergency Medicine

## 2023-09-26 DIAGNOSIS — Y990 Civilian activity done for income or pay: Secondary | ICD-10-CM | POA: Diagnosis not present

## 2023-09-26 DIAGNOSIS — Z23 Encounter for immunization: Secondary | ICD-10-CM | POA: Diagnosis not present

## 2023-09-26 DIAGNOSIS — Z79899 Other long term (current) drug therapy: Secondary | ICD-10-CM | POA: Diagnosis not present

## 2023-09-26 DIAGNOSIS — I10 Essential (primary) hypertension: Secondary | ICD-10-CM | POA: Insufficient documentation

## 2023-09-26 DIAGNOSIS — S61011A Laceration without foreign body of right thumb without damage to nail, initial encounter: Secondary | ICD-10-CM | POA: Insufficient documentation

## 2023-09-26 DIAGNOSIS — S6991XA Unspecified injury of right wrist, hand and finger(s), initial encounter: Secondary | ICD-10-CM | POA: Diagnosis present

## 2023-09-26 DIAGNOSIS — W268XXA Contact with other sharp object(s), not elsewhere classified, initial encounter: Secondary | ICD-10-CM | POA: Insufficient documentation

## 2023-09-26 MED ORDER — TETANUS-DIPHTH-ACELL PERTUSSIS 5-2.5-18.5 LF-MCG/0.5 IM SUSY
0.5000 mL | PREFILLED_SYRINGE | Freq: Once | INTRAMUSCULAR | Status: AC
  Administered 2023-09-26: 0.5 mL via INTRAMUSCULAR
  Filled 2023-09-26: qty 0.5

## 2023-09-26 NOTE — ED Provider Notes (Signed)
 [place] EMERGENCY DEPARTMENT AT [facility] Provider Note   CSN: [id number] Arrival date & time: 09/26/23  1845     Patient presents with: Laceration   Terry Wells is a 53 y.o. male. Cut with box cutter at work. Unknonw last tetanus  {Add pertinent medical, surgical, social history, OB history to HPI:32947}  Laceration      Prior to Admission medications   Medication Sig Start Date End Date Taking? Authorizing Provider  citalopram  (CELEXA ) 10 MG tablet Take 1 tablet (10 mg total) by mouth daily. For mood control 04/07/17   Money, Caron NOVAK, FNP  gabapentin  (NEURONTIN ) 100 MG capsule Take 1 capsule (100 mg total) by mouth 3 (three) times daily. For withdrawal symptoms 04/06/17   Money, Caron NOVAK, FNP  hydrOXYzine  (ATARAX /VISTARIL ) 25 MG tablet Take 1 tablet (25 mg total) by mouth every 6 (six) hours as needed for anxiety. 04/06/17   Money, Caron NOVAK, FNP  lisinopril  (PRINIVIL ,ZESTRIL ) 10 MG tablet Take 1 tablet (10 mg total) by mouth daily. For high blood pressure 04/06/17   Money, Caron B, FNP  naproxen  (NAPROSYN ) 500 MG tablet Take 1 tablet (500 mg total) by mouth 2 (two) times daily. 12/20/19   Theadore Ozell CHRISTELLA, MD  traZODone  (DESYREL ) 50 MG tablet Take 1 tablet (50 mg total) by mouth at bedtime as needed for sleep. 04/06/17   Money, Caron NOVAK, FNP    Allergies: Patient has no known allergies.    Review of Systems  Updated Vital Signs BP (!) 162/114 (BP Location: Left Arm)   Pulse 89   Temp 98.1 F (36.7 C) (Oral)   Resp 18   Ht 6' 2 (1.88 m)   Wt 117.9 kg   SpO2 100%   BMI 33.38 kg/m   Physical Exam  (all labs ordered are listed, but only abnormal results are displayed) Labs Reviewed - No data to display  EKG: None  Radiology: No results found.  {Document cardiac monitor, telemetry assessment procedure when appropriate:32947} .Laceration Repair  Date/Time: 09/26/2023 8:10 PM  Performed by: Minnie Tinnie BRAVO, PA Authorized by: Minnie Tinnie BRAVO,  PA   Consent:    Consent obtained:  Verbal   Consent given by:  Patient   Risks, benefits, and alternatives were discussed: yes     Risks discussed:  Infection, pain, poor cosmetic result and poor wound healing   Alternatives discussed: vs sutures. Universal protocol:    Patient identity confirmed:  Verbally with patient and arm band Anesthesia:    Anesthesia method:  None Laceration details:    Location:  Finger   Length (cm):  1 Treatment:    Area cleansed with:  Shur-Clens and saline   Amount of cleaning:  Standard   Irrigation solution:  Sterile saline   Irrigation volume:  500 Skin repair:    Repair method:  Tissue adhesive Approximation:    Approximation:  Close Repair type:    Repair type:  Simple Post-procedure details:    Dressing:  Open (no dressing)   Procedure completion:  Tolerated well, no immediate complications    Medications Ordered in the ED  Tdap (BOOSTRIX ) injection 0.5 mL (has no administration in time range)      {Click here for ABCD2, HEART and other calculators REFRESH Note before signing:1}                              Medical Decision Making Risk Prescription drug management.   ***  {  Document critical care time when appropriate  Document review of labs and clinical decision tools ie CHADS2VASC2, etc  Document your independent review of radiology images and any outside records  Document your discussion with family members, caretakers and with consultants  Document social determinants of health affecting pt's care  Document your decision making why or why not admission, treatments were needed:32947:::1}   Final diagnoses:  None    ED Discharge Orders     None

## 2023-09-26 NOTE — Discharge Instructions (Signed)
 Glue should fall off in 3-5 days  Return to ED fi you experience red hot swollen finger especially with pus drainage otherwise should heal up on own

## 2023-09-26 NOTE — ED Triage Notes (Signed)
~  2 cm laceration that borders right thumb nail suspects from a box cutter.
